# Patient Record
Sex: Male | Born: 1955 | Race: Black or African American | Hispanic: No | Smoking: Never smoker
Health system: Southern US, Community
[De-identification: ages and names within clinical notes are randomized; demographics above are authoritative.]

## PROBLEM LIST (undated history)

## (undated) DIAGNOSIS — F1010 Alcohol abuse, uncomplicated: Secondary | ICD-10-CM

## (undated) DIAGNOSIS — R569 Unspecified convulsions: Secondary | ICD-10-CM

## (undated) DIAGNOSIS — R011 Cardiac murmur, unspecified: Secondary | ICD-10-CM

## (undated) DIAGNOSIS — E669 Obesity, unspecified: Secondary | ICD-10-CM

## (undated) DIAGNOSIS — E039 Hypothyroidism, unspecified: Secondary | ICD-10-CM

## (undated) HISTORY — PX: GASTRIC BYPASS: SHX52

## (undated) HISTORY — PX: FRACTURE SURGERY: SHX138

---

## 2010-06-06 ENCOUNTER — Emergency Department (HOSPITAL_COMMUNITY): Admission: EM | Admit: 2010-06-06 | Discharge: 2010-06-06 | Payer: Self-pay | Admitting: Emergency Medicine

## 2011-02-23 LAB — RAPID URINE DRUG SCREEN, HOSP PERFORMED
Amphetamines: NOT DETECTED
Barbiturates: NOT DETECTED
Benzodiazepines: NOT DETECTED
Cocaine: NOT DETECTED

## 2011-02-23 LAB — COMPREHENSIVE METABOLIC PANEL
ALT: 21 U/L (ref 0–53)
BUN: 5 mg/dL — ABNORMAL LOW (ref 6–23)
CO2: 26 mEq/L (ref 19–32)
Calcium: 8.5 mg/dL (ref 8.4–10.5)
Creatinine, Ser: 1.02 mg/dL (ref 0.4–1.5)
GFR calc non Af Amer: >60 mL/min (ref >60)
Glucose, Bld: 132 mg/dL — ABNORMAL HIGH (ref 70–99)
Sodium: 143 mEq/L (ref 135–145)
Total Protein: 8.1 g/dL (ref 6.0–8.3)

## 2011-02-23 LAB — DIFFERENTIAL
Eosinophils Absolute: 0.1 10*3/uL (ref 0.0–0.7)
Lymphocytes Relative: 39 % (ref 12–46)
Lymphs Abs: 3.6 10*3/uL (ref 0.7–4.0)
Monocytes Relative: 11 % (ref 3–12)
Neutro Abs: 4.4 10*3/uL (ref 1.7–7.7)
Neutrophils Relative %: 49 % (ref 43–77)

## 2011-02-23 LAB — URINALYSIS, ROUTINE W REFLEX MICROSCOPIC
Hgb urine dipstick: NEGATIVE
Ketones, ur: NEGATIVE mg/dL
Protein, ur: NEGATIVE mg/dL
Urobilinogen, UA: 0.2 mg/dL (ref 0.0–1.0)

## 2011-02-23 LAB — CBC
HCT: 39.1 % (ref 39.0–52.0)
Hemoglobin: 13.3 g/dL (ref 13.0–17.0)
MCH: 29.1 pg (ref 26.0–34.0)
MCHC: 34 g/dL (ref 30.0–36.0)
RDW: 15.1 % (ref 11.5–15.5)

## 2011-02-23 LAB — ETHANOL: Alcohol, Ethyl (B): 331 mg/dL — ABNORMAL HIGH (ref 0–10)

## 2012-01-13 ENCOUNTER — Emergency Department (HOSPITAL_COMMUNITY)
Admission: EM | Admit: 2012-01-13 | Discharge: 2012-01-13 | Discharge status: Against Medical Advice | Payer: Self-pay | Attending: General Surgery | Admitting: General Surgery

## 2012-01-13 ENCOUNTER — Emergency Department (HOSPITAL_COMMUNITY)
Admission: EM | Admit: 2012-01-13 | Discharge: 2012-01-13 | Disposition: A | Discharge status: Home / Self Care | Payer: Self-pay | Attending: Emergency Medicine | Admitting: Emergency Medicine

## 2012-01-13 ENCOUNTER — Encounter (HOSPITAL_COMMUNITY): Payer: Self-pay | Admitting: Emergency Medicine

## 2012-01-13 DIAGNOSIS — F10929 Alcohol use, unspecified with intoxication, unspecified: Secondary | ICD-10-CM

## 2012-01-13 DIAGNOSIS — Z043 Encounter for examination and observation following other accident: Secondary | ICD-10-CM | POA: Insufficient documentation

## 2012-01-13 DIAGNOSIS — F1010 Alcohol abuse, uncomplicated: Secondary | ICD-10-CM | POA: Insufficient documentation

## 2012-01-13 DIAGNOSIS — R52 Pain, unspecified: Secondary | ICD-10-CM | POA: Insufficient documentation

## 2012-01-13 LAB — ETHANOL: Alcohol, Ethyl (B): 438 mg/dL (ref 0–11)

## 2012-01-13 NOTE — ED Notes (Signed)
Restraints removed at this time. Patient able to follow commands; alert to self, reoriented to location and time; responding appropriately to questions. Will continue to monitor.
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
## 2012-02-19 ENCOUNTER — Emergency Department (HOSPITAL_COMMUNITY): Payer: Self-pay

## 2012-02-19 ENCOUNTER — Emergency Department (HOSPITAL_COMMUNITY)
Admission: EM | Admit: 2012-02-19 | Discharge: 2012-02-19 | Disposition: A | Discharge status: Home / Self Care | Payer: Self-pay | Attending: Emergency Medicine | Admitting: Emergency Medicine

## 2012-02-19 ENCOUNTER — Encounter (HOSPITAL_COMMUNITY): Payer: Self-pay | Admitting: *Deleted

## 2012-02-19 DIAGNOSIS — F1010 Alcohol abuse, uncomplicated: Secondary | ICD-10-CM | POA: Insufficient documentation

## 2012-02-19 DIAGNOSIS — F10929 Alcohol use, unspecified with intoxication, unspecified: Secondary | ICD-10-CM

## 2012-02-19 HISTORY — DX: Alcohol abuse, uncomplicated: F10.10

## 2012-02-19 MED ORDER — SODIUM CHLORIDE 0.9 % IV BOLUS (SEPSIS): 1000.0000 mL | Freq: Once | INTRAVENOUS | Status: DC

## 2012-02-19 NOTE — ED Notes (Signed)
ZOX:[id number]<AV> Expected date:02/19/12<BR> Expected time:<BR> Means of arrival:<BR> Comments:<BR> EMS [facility] - etoh

## 2012-02-19 NOTE — ED Notes (Signed)
Pt left AMA. MD notified.  

## 2012-02-19 NOTE — ED Notes (Signed)
Pt in by [facility], picked up behind a gas station lying in the parking lot. ETOH on board. Pt admits to one bottle of wine today, but found with empty pack of beer. No c/o. PD requested transport to hospital because they felt he was AMS. A&Ox4.

## 2012-02-19 NOTE — Progress Notes (Signed)
Pt listed as self pay with no insurance coverage Pt confirms he is self pay [place] resident CM and [facility] coordinator unable to speak with him. Pt left AMA

## 2012-02-19 NOTE — ED Notes (Signed)
Did hourly rounding on patient and patient was not in room. Checked both bathrooms and the patient was not in either restroom. Another tech and I went and checked the waiting room and looked outside patient was not seen. We also asked a few staff members in triage if they seen him walk by and they stated no. Reported all this information to the RN and to the charge nurse on duty.
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
## 2012-02-19 NOTE — ED Notes (Signed)
Refusing iv placement at this time-will attempt later

## 2012-02-19 NOTE — ED Notes (Signed)
Pt refuses ct scan, labs, and iv

## 2012-02-19 NOTE — ED Notes (Signed)
REFUSING BASIC LABS. NURSE WAS AWARE OF PT REFUSING LABS

## 2012-02-19 NOTE — ED Notes (Signed)
Noncompliant with triage process-not willing to answer questions, put gown on

## 2012-02-19 NOTE — ED Notes (Signed)
pts family member came to the front window and said that she told the girl [place] at the front desk that she was here to see pt. Abbi told her that he could not have visitors at this time. When I talked to stacey his nurse she had returned from lunch and went into the room and pt was not there and all his belongings were gone. They looked around the dept he could not be found. Family member gave me his name and it did not pull up his information due to pt had not been registered due to he was not there when they went into the room. Registerstration gave me a number to look up and when i looked it up it has said he was not in the room. When I explained this to her she was upset that she was coming to get him and they were told that he was here. Elanine explained to her we were sorry and unless it was in the system we can not know when he left. They also wanted to know if anything was left in the room and elaine looked in the room and nothing was found left behind.

## 2016-01-25 ENCOUNTER — Inpatient Hospital Stay (HOSPITAL_COMMUNITY)
Admission: EM | Admit: 2016-01-25 | Discharge: 2016-01-30 | DRG: 897 | Disposition: A | Discharge status: Home / Self Care | Payer: Non-veteran care | Attending: Internal Medicine | Admitting: Internal Medicine

## 2016-01-25 ENCOUNTER — Observation Stay (HOSPITAL_COMMUNITY): Payer: Non-veteran care

## 2016-01-25 ENCOUNTER — Encounter (HOSPITAL_COMMUNITY): Payer: Self-pay | Admitting: Emergency Medicine

## 2016-01-25 ENCOUNTER — Emergency Department (HOSPITAL_COMMUNITY): Payer: Non-veteran care

## 2016-01-25 DIAGNOSIS — R569 Unspecified convulsions: Secondary | ICD-10-CM

## 2016-01-25 DIAGNOSIS — F10929 Alcohol use, unspecified with intoxication, unspecified: Secondary | ICD-10-CM | POA: Insufficient documentation

## 2016-01-25 DIAGNOSIS — E785 Hyperlipidemia, unspecified: Secondary | ICD-10-CM | POA: Diagnosis present

## 2016-01-25 DIAGNOSIS — F10931 Alcohol use, unspecified with withdrawal delirium: Secondary | ICD-10-CM | POA: Insufficient documentation

## 2016-01-25 DIAGNOSIS — N289 Disorder of kidney and ureter, unspecified: Secondary | ICD-10-CM

## 2016-01-25 DIAGNOSIS — Z6838 Body mass index (BMI) 38.0-38.9, adult: Secondary | ICD-10-CM

## 2016-01-25 DIAGNOSIS — R000 Tachycardia, unspecified: Secondary | ICD-10-CM | POA: Diagnosis present

## 2016-01-25 DIAGNOSIS — R32 Unspecified urinary incontinence: Secondary | ICD-10-CM | POA: Diagnosis present

## 2016-01-25 DIAGNOSIS — R011 Cardiac murmur, unspecified: Secondary | ICD-10-CM | POA: Diagnosis present

## 2016-01-25 DIAGNOSIS — F10129 Alcohol abuse with intoxication, unspecified: Secondary | ICD-10-CM

## 2016-01-25 DIAGNOSIS — E669 Obesity, unspecified: Secondary | ICD-10-CM | POA: Diagnosis present

## 2016-01-25 DIAGNOSIS — R739 Hyperglycemia, unspecified: Secondary | ICD-10-CM | POA: Diagnosis present

## 2016-01-25 DIAGNOSIS — R740 Nonspecific elevation of levels of transaminase and lactic acid dehydrogenase [LDH]: Secondary | ICD-10-CM | POA: Diagnosis present

## 2016-01-25 DIAGNOSIS — Y903 Blood alcohol level of 60-79 mg/100 ml: Secondary | ICD-10-CM | POA: Diagnosis present

## 2016-01-25 DIAGNOSIS — F1010 Alcohol abuse, uncomplicated: Secondary | ICD-10-CM | POA: Diagnosis present

## 2016-01-25 DIAGNOSIS — I10 Essential (primary) hypertension: Secondary | ICD-10-CM | POA: Diagnosis present

## 2016-01-25 DIAGNOSIS — F10231 Alcohol dependence with withdrawal delirium: Secondary | ICD-10-CM | POA: Diagnosis not present

## 2016-01-25 DIAGNOSIS — E872 Acidosis: Secondary | ICD-10-CM | POA: Diagnosis present

## 2016-01-25 DIAGNOSIS — E039 Hypothyroidism, unspecified: Secondary | ICD-10-CM | POA: Diagnosis present

## 2016-01-25 DIAGNOSIS — R4182 Altered mental status, unspecified: Secondary | ICD-10-CM

## 2016-01-25 DIAGNOSIS — N179 Acute kidney failure, unspecified: Secondary | ICD-10-CM | POA: Diagnosis present

## 2016-01-25 DIAGNOSIS — Z9884 Bariatric surgery status: Secondary | ICD-10-CM

## 2016-01-25 DIAGNOSIS — K7010 Alcoholic hepatitis without ascites: Secondary | ICD-10-CM | POA: Diagnosis present

## 2016-01-25 DIAGNOSIS — G9340 Encephalopathy, unspecified: Secondary | ICD-10-CM | POA: Diagnosis present

## 2016-01-25 DIAGNOSIS — R7989 Other specified abnormal findings of blood chemistry: Secondary | ICD-10-CM | POA: Diagnosis present

## 2016-01-25 DIAGNOSIS — R7401 Elevation of levels of liver transaminase levels: Secondary | ICD-10-CM | POA: Diagnosis present

## 2016-01-25 HISTORY — DX: Obesity, unspecified: E66.9

## 2016-01-25 HISTORY — DX: Cardiac murmur, unspecified: R01.1

## 2016-01-25 HISTORY — DX: Unspecified convulsions: R56.9

## 2016-01-25 HISTORY — DX: Hypothyroidism, unspecified: E03.9

## 2016-01-25 LAB — I-STAT ARTERIAL BLOOD GAS, ED
Acid-Base Excess: 1 mmol/L (ref 0.0–2.0)
BICARBONATE: 23.7 meq/L (ref 20.0–24.0)
O2 SAT: 94 %
PCO2 ART: 31.7 mmHg — AB (ref 35.0–45.0)
TCO2: 25 mmol/L (ref 0–100)
pH, Arterial: 7.481 — ABNORMAL HIGH (ref 7.350–7.450)
pO2, Arterial: 65 mmHg — ABNORMAL LOW (ref 80.0–100.0)

## 2016-01-25 LAB — COMPREHENSIVE METABOLIC PANEL
ALK PHOS: 85 U/L (ref 38–126)
ALT: 950 U/L — AB (ref 17–63)
AST: 1827 U/L — AB (ref 15–41)
Albumin: 4.3 g/dL (ref 3.5–5.0)
Anion gap: 19 — ABNORMAL HIGH (ref 5–15)
BILIRUBIN TOTAL: 0.7 mg/dL (ref 0.3–1.2)
BUN: 18 mg/dL (ref 6–20)
CALCIUM: 9.4 mg/dL (ref 8.9–10.3)
CHLORIDE: 101 mmol/L (ref 101–111)
CO2: 22 mmol/L (ref 22–32)
CREATININE: 1.32 mg/dL — AB (ref 0.61–1.24)
GFR, EST NON AFRICAN AMERICAN: 57 mL/min — AB (ref >60)
Glucose, Bld: 234 mg/dL — ABNORMAL HIGH (ref 65–99)
Potassium: 4.8 mmol/L (ref 3.5–5.1)
Sodium: 142 mmol/L (ref 135–145)
Total Protein: 8.2 g/dL — ABNORMAL HIGH (ref 6.5–8.1)

## 2016-01-25 LAB — CBG MONITORING, ED
Glucose-Capillary: 171 mg/dL — ABNORMAL HIGH (ref 65–99)
Glucose-Capillary: 163 mg/dL — ABNORMAL HIGH (ref 65–99)

## 2016-01-25 LAB — URINALYSIS, ROUTINE W REFLEX MICROSCOPIC
BILIRUBIN URINE: NEGATIVE
Glucose, UA: >1000 mg/dL — AB
HGB URINE DIPSTICK: NEGATIVE
Ketones, ur: NEGATIVE mg/dL
Leukocytes, UA: NEGATIVE
NITRITE: NEGATIVE
PROTEIN: NEGATIVE mg/dL
Specific Gravity, Urine: 1.028 (ref 1.005–1.030)
pH: 5.5 (ref 5.0–8.0)

## 2016-01-25 LAB — CBC WITH DIFFERENTIAL/PLATELET
BASOS PCT: 0 %
Basophils Absolute: 0 10*3/uL (ref 0.0–0.1)
EOS ABS: 0 10*3/uL (ref 0.0–0.7)
EOS PCT: 0 %
HCT: 47.7 % (ref 39.0–52.0)
Hemoglobin: 16.2 g/dL (ref 13.0–17.0)
LYMPHS ABS: 1.7 10*3/uL (ref 0.7–4.0)
Lymphocytes Relative: 18 %
MCH: 28.8 pg (ref 26.0–34.0)
MCHC: 34 g/dL (ref 30.0–36.0)
MCV: 84.7 fL (ref 78.0–100.0)
Monocytes Absolute: 1.4 10*3/uL — ABNORMAL HIGH (ref 0.1–1.0)
Monocytes Relative: 14 %
NEUTROS PCT: 68 %
Neutro Abs: 6.6 10*3/uL (ref 1.7–7.7)
PLATELETS: 231 10*3/uL (ref 150–400)
RBC: 5.63 MIL/uL (ref 4.22–5.81)
RDW: 13.3 % (ref 11.5–15.5)
WBC: 9.8 10*3/uL (ref 4.0–10.5)

## 2016-01-25 LAB — RAPID URINE DRUG SCREEN, HOSP PERFORMED
Amphetamines: NOT DETECTED
Barbiturates: NOT DETECTED
Benzodiazepines: NOT DETECTED
COCAINE: NOT DETECTED
OPIATES: NOT DETECTED
Tetrahydrocannabinol: NOT DETECTED

## 2016-01-25 LAB — VOLATILES,BLD-ACETONE,ETHANOL,ISOPROP,METHANOL
ACETONE, BLOOD: NEGATIVE % (ref 0.000–0.010)
ETHANOL, BLOOD: NEGATIVE % (ref 0.000–0.010)
Isopropanol, blood: NEGATIVE % (ref 0.000–0.010)
METHANOL, BLOOD: NEGATIVE % (ref 0.000–0.010)

## 2016-01-25 LAB — URINE MICROSCOPIC-ADD ON
BACTERIA UA: NONE SEEN
RBC / HPF: NONE SEEN RBC/hpf (ref 0–5)

## 2016-01-25 LAB — VITAMIN B12: VITAMIN B 12: 1002 pg/mL — AB (ref 180–914)

## 2016-01-25 LAB — I-STAT CG4 LACTIC ACID, ED: Lactic Acid, Venous: 7.37 mmol/L (ref 0.5–2.0)

## 2016-01-25 LAB — CK: Total CK: 120 U/L (ref 49–397)

## 2016-01-25 LAB — LACTIC ACID, PLASMA: Lactic Acid, Venous: 3.5 mmol/L (ref 0.5–2.0)

## 2016-01-25 LAB — AMMONIA: AMMONIA: 36 umol/L — AB (ref 9–35)

## 2016-01-25 LAB — ETHANOL: ALCOHOL ETHYL (B): 75 mg/dL — AB (ref <5)

## 2016-01-25 LAB — GLUCOSE, CAPILLARY: GLUCOSE-CAPILLARY: 135 mg/dL — AB (ref 65–99)

## 2016-01-25 MED ORDER — VITAMIN B-1 100 MG PO TABS: 100.0000 mg | ORAL_TABLET | Freq: Every day | ORAL | Status: DC

## 2016-01-25 MED ORDER — LORAZEPAM 2 MG/ML IJ SOLN: 1.0000 mg | Freq: Four times a day (QID) | INTRAMUSCULAR | Status: DC | PRN

## 2016-01-25 MED ORDER — ACETAMINOPHEN 325 MG PO TABS: 650.0000 mg | ORAL_TABLET | Freq: Four times a day (QID) | ORAL | Status: DC | PRN

## 2016-01-25 MED ORDER — LORAZEPAM 2 MG/ML IJ SOLN
1.0000 mg | Freq: Once | INTRAMUSCULAR | Status: AC
  Administered 2016-01-25: 1 mg via INTRAVENOUS
  Filled 2016-01-25: qty 1

## 2016-01-25 MED ORDER — THIAMINE HCL 100 MG/ML IJ SOLN: 100.0000 mg | Freq: Every day | INTRAMUSCULAR | Status: DC

## 2016-01-25 MED ORDER — SODIUM CHLORIDE 0.9 % IV SOLN
1000.0000 mL | INTRAVENOUS | Status: DC
  Administered 2016-01-25 – 2016-01-26 (×2): 1000 mL via INTRAVENOUS

## 2016-01-25 MED ORDER — ONDANSETRON HCL 4 MG/2ML IJ SOLN: 4.0000 mg | Freq: Four times a day (QID) | INTRAMUSCULAR | Status: DC | PRN

## 2016-01-25 MED ORDER — LORAZEPAM 2 MG/ML IJ SOLN
2.0000 mg | INTRAMUSCULAR | Status: DC | PRN
  Administered 2016-01-25 (×3): 2 mg via INTRAVENOUS
  Administered 2016-01-25: 3 mg via INTRAVENOUS
  Administered 2016-01-26 (×2): 2 mg via INTRAVENOUS
  Administered 2016-01-26 (×2): 3 mg via INTRAVENOUS
  Administered 2016-01-26 (×2): 2 mg via INTRAVENOUS
  Administered 2016-01-26: 3 mg via INTRAVENOUS
  Administered 2016-01-26 (×3): 2 mg via INTRAVENOUS
  Administered 2016-01-26 – 2016-01-27 (×5): 3 mg via INTRAVENOUS
  Filled 2016-01-25: qty 2
  Filled 2016-01-25: qty 1
  Filled 2016-01-25 (×2): qty 2
  Filled 2016-01-25 (×2): qty 1
  Filled 2016-01-25: qty 2
  Filled 2016-01-25: qty 1
  Filled 2016-01-25: qty 2
  Filled 2016-01-25 (×5): qty 1
  Filled 2016-01-25 (×2): qty 2
  Filled 2016-01-25 (×2): qty 1
  Filled 2016-01-25 (×2): qty 2

## 2016-01-25 MED ORDER — LORAZEPAM 2 MG/ML IJ SOLN: 1.0000 mg | INTRAMUSCULAR | Status: DC | PRN

## 2016-01-25 MED ORDER — LORAZEPAM 1 MG PO TABS: 1.0000 mg | ORAL_TABLET | Freq: Four times a day (QID) | ORAL | Status: DC | PRN

## 2016-01-25 MED ORDER — HEPARIN SODIUM (PORCINE) 5000 UNIT/ML IJ SOLN
5000.0000 [IU] | Freq: Three times a day (TID) | INTRAMUSCULAR | Status: DC
  Administered 2016-01-25 – 2016-01-26 (×5): 5000 [IU] via SUBCUTANEOUS
  Filled 2016-01-25 (×5): qty 1

## 2016-01-25 MED ORDER — FOLIC ACID 1 MG PO TABS: 1.0000 mg | ORAL_TABLET | Freq: Every day | ORAL | Status: DC

## 2016-01-25 MED ORDER — ONDANSETRON HCL 4 MG PO TABS: 4.0000 mg | ORAL_TABLET | Freq: Four times a day (QID) | ORAL | Status: DC | PRN

## 2016-01-25 MED ORDER — ACETAMINOPHEN 650 MG RE SUPP
650.0000 mg | Freq: Four times a day (QID) | RECTAL | Status: DC | PRN
Start: 2016-01-25 — End: 2016-01-26

## 2016-01-25 MED ORDER — SODIUM CHLORIDE 0.9 % IV SOLN
1000.0000 mg | Freq: Once | INTRAVENOUS | Status: AC
  Administered 2016-01-25: 1000 mg via INTRAVENOUS
  Filled 2016-01-25: qty 10

## 2016-01-25 MED ORDER — LORAZEPAM 2 MG/ML IJ SOLN
2.0000 mg | Freq: Once | INTRAMUSCULAR | Status: AC
  Administered 2016-01-25: 2 mg via INTRAVENOUS
  Filled 2016-01-25: qty 1

## 2016-01-25 MED ORDER — INSULIN ASPART 100 UNIT/ML ~~LOC~~ SOLN: 0.0000 [IU] | Freq: Every day | SUBCUTANEOUS | Status: DC

## 2016-01-25 MED ORDER — POLYETHYLENE GLYCOL 3350 17 G PO PACK: 17.0000 g | PACK | Freq: Every day | ORAL | Status: DC | PRN

## 2016-01-25 MED ORDER — SODIUM CHLORIDE 0.9 % IV SOLN
1000.0000 mL | Freq: Once | INTRAVENOUS | Status: AC
  Administered 2016-01-25: 1000 mL via INTRAVENOUS

## 2016-01-25 MED ORDER — ADULT MULTIVITAMIN W/MINERALS CH: 1.0000 | ORAL_TABLET | Freq: Every day | ORAL | Status: DC

## 2016-01-25 MED ORDER — SODIUM CHLORIDE 0.9 % IV SOLN: INTRAVENOUS | Status: DC

## 2016-01-25 MED ORDER — INSULIN ASPART 100 UNIT/ML ~~LOC~~ SOLN
0.0000 [IU] | Freq: Three times a day (TID) | SUBCUTANEOUS | Status: DC
Start: 2016-01-25 — End: 2016-01-26
  Administered 2016-01-25: 3 [IU] via SUBCUTANEOUS
  Filled 2016-01-25: qty 1

## 2016-01-25 NOTE — Progress Notes (Signed)
Called Dr. Lendell Caprice to get order for sitter.

## 2016-01-25 NOTE — Progress Notes (Signed)
EEG completed; results pending.    
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
## 2016-01-25 NOTE — ED Notes (Signed)
Spoke with Dr. Benjamine Mola regarding pt. MRI. Will hold on MRI at this time. Will hold additional doses of ativan at this time.

## 2016-01-25 NOTE — ED Notes (Signed)
Pt. Will not keep gown or monitors on at this time.

## 2016-01-25 NOTE — ED Notes (Signed)
PATIENT refused to keep IV fluids connected RN disconnected RN removed.

## 2016-01-25 NOTE — Procedures (Signed)
ELECTROENCEPHALOGRAM REPORT  Date of Study: 01/25/2016  Patient's Name: Aaron Norman MRN: [id number] Date of Birth: 1955-12-29  Referring Provider: Dr. Ritta Slot  Clinical History: This is a 37 yaer old man with new onset seizures.   Medications: acetaminophen (TYLENOL) tablet 650 mg  Technical Summary: A multichannel digital EEG recording measured by the international 10-20 system with electrodes applied with paste and impedances below 5000 ohms performed as portable with EKG monitoring in an awake and asleep patient.  Hyperventilation and photic stimulation were not performed.  The digital EEG was referentially recorded, reformatted, and digitally filtered in a variety of bipolar and referential montages for optimal display.   Description: The patient is awake and asleep during the recording. He is noted to be restless during the study. There is no clear posterior dominant rhythm. The background consists of a large amount of diffuse medium voltage 2-3 Hz delta slowing, with occasional 4-5 Hz theta slowing seen, at times sharply contoured with triphasic-like appearance noted. During drowsiness and sleep, there is an increase in theta and delta slowing of the background with poorly formed vertex waves seen.  Hyperventilation and photic stimulation were not performed.  There were no epileptiform discharges or electrographic seizures seen.    EKG lead showed sinus tachycardia.  Impression: This awake and asleep EEG is abnormal due to moderate diffuse slowing of the waking background, at times with triphasic-like appearance.  Clinical Correlation of the above findings indicates diffuse cerebral dysfunction that is non-specific in etiology and can be seen with hypoxic/ischemic injury, toxic/metabolic encephalopathies, or medication effect.  The absence of epileptiform discharges does not rule out a clinical diagnosis of epilepsy.  Clinical correlation is advised.   Patrcia Dolly,  M.D.

## 2016-01-25 NOTE — ED Provider Notes (Addendum)
CSN: [id number]     Arrival date & time 01/25/16  0346 History   First MD Initiated Contact with Patient 01/25/16 0358     Chief Complaint  Patient presents with  . Seizures     (Consider location/radiation/quality/duration/timing/severity/associated sxs/prior Treatment) Patient is a 60 y.o. male presenting with seizures. The history is provided by the EMS personnel and a relative. The history is limited by the condition of the patient (Unresponsive).  Seizures He was brought in by EMS after having a seizure at home. Family member states that he has a history of alcohol abuse and had resumed drinking about 3 days ago and has been drinking heavily during that time. Palate member speculates that he fell out of bed and hit his head prior to seizure but this was not witnessed. She describes a generalized seizure. EMS reports additional seizure in the ambulance and was treated with midazolam. He has no prior history of seizures.  Past Medical History  Diagnosis Date  . Alcohol abuse    History reviewed. No pertinent past surgical history. No family history on file. Social History  Substance Use Topics  . Smoking status: Unknown If Ever Smoked  . Smokeless tobacco: None  . Alcohol Use: Yes     Comment: heavily    Review of Systems  Unable to perform ROS: Patient unresponsive  Neurological: Positive for seizures.      Allergies  Other  Home Medications   Prior to Admission medications   Not on File   BP 149/99 mmHg  Pulse 112  Temp(Src) 99.3 F (37.4 C) (Axillary)  Resp 18  SpO2 97% Physical Exam  Nursing note and vitals reviewed.  60 year old male, resting comfortably and in no acute distress. Vital signs are significant for hypertension. Oxygen saturation is 97%, which is normal. Head is normocephalic and atraumatic. PERRLA. Oropharynx is clear. Neck is nontender without adenopathy or JVD. Back is nontender and there is no CVA tenderness. Lungs are clear without  rales, wheezes, or rhonchi. Chest is nontender. Heart has regular rate and rhythm without murmur. Abdomen is soft, flat, nontender without masses or hepatosplenomegaly and peristalsis is normoactive. Extremities have no cyanosis or edema, full range of motion is present. Skin is warm and dry without rash. Neurologic: he responds to deep pain but is not observed to have any purposeful movement, cranial nerves are grossly intact, there are nogross motor deficits.  ED Course  Procedures (including critical care time) Labs Review Results for orders placed or performed during the hospital encounter of 01/25/16  Ethanol  Result Value Ref Range   Alcohol, Ethyl (B) 75 (H) <5 mg/dL  Comprehensive metabolic panel  Result Value Ref Range   Sodium 142 135 - 145 mmol/L   Potassium 4.8 3.5 - 5.1 mmol/L   Chloride 101 101 - 111 mmol/L   CO2 22 22 - 32 mmol/L   Glucose, Bld 234 (H) 65 - 99 mg/dL   BUN 18 6 - 20 mg/dL   Creatinine, Ser 1.47 (H) 0.61 - 1.24 mg/dL   Calcium 9.4 8.9 - 82.9 mg/dL   Total Protein 8.2 (H) 6.5 - 8.1 g/dL   Albumin 4.3 3.5 - 5.0 g/dL   AST 5621 (H) 15 - 41 U/L   ALT 950 (H) 17 - 63 U/L   Alkaline Phosphatase 85 38 - 126 U/L   Total Bilirubin 0.7 0.3 - 1.2 mg/dL   GFR calc non Af Amer 57 (L) >60 mL/min   GFR calc Af  Amer >60 >60 mL/min   Anion gap 19 (H) 5 - 15  CBC with Differential  Result Value Ref Range   WBC 9.8 4.0 - 10.5 K/uL   RBC 5.63 4.22 - 5.81 MIL/uL   Hemoglobin 16.2 13.0 - 17.0 g/dL   HCT 16.1 09.6 - 04.5 %   MCV 84.7 78.0 - 100.0 fL   MCH 28.8 26.0 - 34.0 pg   MCHC 34.0 30.0 - 36.0 g/dL   RDW 40.9 81.1 - 91.4 %   Platelets 231 150 - 400 K/uL   Neutrophils Relative % 68 %   Neutro Abs 6.6 1.7 - 7.7 K/uL   Lymphocytes Relative 18 %   Lymphs Abs 1.7 0.7 - 4.0 K/uL   Monocytes Relative 14 %   Monocytes Absolute 1.4 (H) 0.1 - 1.0 K/uL   Eosinophils Relative 0 %   Eosinophils Absolute 0.0 0.0 - 0.7 K/uL   Basophils Relative 0 %   Basophils  Absolute 0.0 0.0 - 0.1 K/uL  Urinalysis, Routine w reflex microscopic  Result Value Ref Range   Color, Urine YELLOW YELLOW   APPearance CLEAR CLEAR   Specific Gravity, Urine 1.028 1.005 - 1.030   pH 5.5 5.0 - 8.0   Glucose, UA >1000 (A) NEGATIVE mg/dL   Hgb urine dipstick NEGATIVE NEGATIVE   Bilirubin Urine NEGATIVE NEGATIVE   Ketones, ur NEGATIVE NEGATIVE mg/dL   Protein, ur NEGATIVE NEGATIVE mg/dL   Nitrite NEGATIVE NEGATIVE   Leukocytes, UA NEGATIVE NEGATIVE  Urine rapid drug screen (hosp performed)  Result Value Ref Range   Opiates NONE DETECTED NONE DETECTED   Cocaine NONE DETECTED NONE DETECTED   Benzodiazepines NONE DETECTED NONE DETECTED   Amphetamines NONE DETECTED NONE DETECTED   Tetrahydrocannabinol NONE DETECTED NONE DETECTED   Barbiturates NONE DETECTED NONE DETECTED  Urine microscopic-add on  Result Value Ref Range   Squamous Epithelial / LPF 0-5 (A) NONE SEEN   WBC, UA 0-5 0 - 5 WBC/hpf   RBC / HPF NONE SEEN 0 - 5 RBC/hpf   Bacteria, UA NONE SEEN NONE SEEN  I-Stat CG4 Lactic Acid, ED  Result Value Ref Range   Lactic Acid, Venous 7.37 (HH) 0.5 - 2.0 mmol/L   Comment NOTIFIED PHYSICIAN    Imaging Review Ct Head Wo Contrast  01/25/2016  CLINICAL DATA:  Seizure today. No history of seizures. Nonresponsive. History of alcohol abuse. EXAM: CT HEAD WITHOUT CONTRAST CT CERVICAL SPINE WITHOUT CONTRAST TECHNIQUE: Multidetector CT imaging of the head and cervical spine was performed following the standard protocol without intravenous contrast. Multiplanar CT image reconstructions of the cervical spine were also generated. COMPARISON:  None. FINDINGS: CT HEAD FINDINGS The ventricles and sulci are normal. No intraparenchymal hemorrhage, mass effect nor midline shift. No acute large vascular territory infarcts. No abnormal extra-axial fluid collections. Basal cisterns are patent. Mild calcific atherosclerosis of the carotid siphons. No skull fracture. The included ocular globes  and orbital contents are non-suspicious. Old RIGHT medial orbital blowout fracture. Mild paranasal sinus mucosal thickening. CT CERVICAL SPINE FINDINGS Large body habitus results in overall noisy image quality. Cervical vertebral bodies intact and aligned with maintenance of cervical lordosis. Moderate disc C6-7 disc height loss, with irregular endplates most compatible with Schmorl's nodes, sclerosis and spurring. Multilevel mild facet arthropathy. C1-2 articulation maintained. Medial course of LEFT Common carotid artery, normal variant. IMPRESSION: CT HEAD: Negative noncontrast CT head for age. CT CERVICAL SPINE: Straightened cervical lordosis without acute fracture or malalignment. Electronically Signed   By: Pernell Dupre  Bloomer M.D.   On: 01/25/2016 04:50   Ct Cervical Spine Wo Contrast  01/25/2016  CLINICAL DATA:  Seizure today. No history of seizures. Nonresponsive. History of alcohol abuse. EXAM: CT HEAD WITHOUT CONTRAST CT CERVICAL SPINE WITHOUT CONTRAST TECHNIQUE: Multidetector CT imaging of the head and cervical spine was performed following the standard protocol without intravenous contrast. Multiplanar CT image reconstructions of the cervical spine were also generated. COMPARISON:  None. FINDINGS: CT HEAD FINDINGS The ventricles and sulci are normal. No intraparenchymal hemorrhage, mass effect nor midline shift. No acute large vascular territory infarcts. No abnormal extra-axial fluid collections. Basal cisterns are patent. Mild calcific atherosclerosis of the carotid siphons. No skull fracture. The included ocular globes and orbital contents are non-suspicious. Old RIGHT medial orbital blowout fracture. Mild paranasal sinus mucosal thickening. CT CERVICAL SPINE FINDINGS Large body habitus results in overall noisy image quality. Cervical vertebral bodies intact and aligned with maintenance of cervical lordosis. Moderate disc C6-7 disc height loss, with irregular endplates most compatible with Schmorl's  nodes, sclerosis and spurring. Multilevel mild facet arthropathy. C1-2 articulation maintained. Medial course of LEFT Common carotid artery, normal variant. IMPRESSION: CT HEAD: Negative noncontrast CT head for age. CT CERVICAL SPINE: Straightened cervical lordosis without acute fracture or malalignment. Electronically Signed   By: Awilda Metro M.D.   On: 01/25/2016 04:50   I have personally reviewed and evaluated these images and lab results as part of my medical decision-making.  CRITICAL CARE Performed by: Dione Booze Total critical care time: 90 minutes Critical care time was exclusive of separately billable procedures and treating other patients. Critical care was necessary to treat or prevent imminent or life-threatening deterioration. Critical care was time spent personally by me on the following activities: development of treatment plan with patient and/or surrogate as well as nursing, discussions with consultants, evaluation of patient's response to treatment, examination of patient, obtaining history from patient or surrogate, ordering and performing treatments and interventions, ordering and review of laboratory studies, ordering and review of radiographic studies, pulse oximetry and re-evaluation of patient's condition.  MDM   Final diagnoses:  Seizure (HCC)  Alcohol intoxication, with unspecified complication (HCC)  Altered mental status, unspecified altered mental status type  Renal insufficiency  Elevated lactic acid level    Seizure,, possible head injury. Screening labs are obtained including alcohol level and he will be sent for CT of head. Because of repeated seizures, he is given a dose of lorazepam. RN does state he had an additional seizure in the ED prior to my going in to see him. Old records are reviewed showing ED visits for alcohol abuse 4 years ago but no visits since then and no record of seizures.  Alcohol level is come back at 75. If he has been drinking  heavily for an extended period of time, level of 75 could be well enough to in precipitate alcohol withdrawal seizures. Patient was observed and he became slightly more awake but does not never to the point where he would follow commands or speak. He was noted to get slightly tremulous so he was given additional lorazepam and was given loading dose of levetiracetam. CT showed no evidence of intracranial injury. Case has been discussed with Toya Smothers, nurse practitioner for [facility], who agrees to admit the patient to stepdown unit. Neurology has been consulted and feel he most likely has alcohol withdrawal seizure.  Dione Booze, MD 01/25/16 4782  Dione Booze, MD 01/25/16 [phone]

## 2016-01-25 NOTE — ED Notes (Signed)
Per EMS, pt from home with c/o seizure. Pt has no seizure history. Responsive to painful stimuli. CBG-220 BP-150/90, HR-120. Upon arrival, pt experiencing jerking movements to the arms and legs. EDP notified

## 2016-01-25 NOTE — ED Notes (Signed)
This RN called to room by patient sister, pt. Attempting to get out of bed. Pt. Remains nonverbal, alert with eyes open. Pt. Redirected back to bed, security at bedside for assistance. Admitting NP at bedside.

## 2016-01-25 NOTE — ED Notes (Signed)
Per MD order do not given Sliding Scale Insulin for  CBG less than 200

## 2016-01-25 NOTE — ED Notes (Signed)
Pt transported to EEG 

## 2016-01-25 NOTE — Progress Notes (Signed)
Received patient from ED accompanied by nurse and 3 Security officers, family also present. Pt noted to be confused and squirming all over the bed. CHG bath given. Placed on CCM and patient pulling at leads. Pt adm called to CCMD.

## 2016-01-25 NOTE — H&P (Signed)
[facility] History and Physical  Boubacar Lerette ZOX:[id number] DOB: 04-May-1956 DOA: 01/25/2016  Referring physician: Preston Fleeting PCP: No primary care provider on file. [facility] [place]  Chief Complaint: acute encephalopathy/tremors  HPI: Aaron Norman is a 60 y.o. male past medical history that includes alcohol abuse, obesity as as to the emergency department via EMS from home with the chief complaint of seizure. He had additional seizure activity in the ambulance as well as in the emergency department.  Formation is provided by the sister who is at the bedside. Reports he has a history of alcohol abuse was sober for many many years while in prison was released 3 months ago. She states last week she was aware that he drank a bottle of wine and he "seemed okay". She reports coming home and find that him on the floor and "his whole body was jerking". She also reports that over the last 3 days he's had 3 bottles of wine. She denies any recent illness travel or sick contacts. She does report loss of bladder control. He called EMS who transported him to emergency department and reported an additional seizure. hHe was provided with midazolam. History denies patient having any history of seizures.  In the emergency department he is afebrile hemodynamically stable with a tachycardia of 118. He is provided with IV fluids, 2 mg of Ativan and 1000 mg of Keppra.  Review of Systems:  10 point review of systems complete and all systems are negative except as indicated in the history of present illness  Past Medical History  Diagnosis Date  . Alcohol abuse   . Obesity   . Seizure [facility])    Past Surgical History  Procedure Laterality Date  . Gastric bypass     Social History:  reports that he drinks alcohol. His tobacco and drug histories are not on file. He currently lives with his sister was incarcerated until 3 months ago for an extended period of time showed drinking last week is independent with  ADLs Allergies  Allergen Reactions  . Other     Narcotics "makes me crazy."    No family history on file. family medical history of uterus Sr. positive for CAD, prostate cancer hypertension and obesity, ABDs  Prior to Admission medications   Not on File   Physical Exam: Filed Vitals:   01/25/16 0630 01/25/16 0645 01/25/16 0730 01/25/16 0745  BP: 165/69 146/97 149/99 140/96  Pulse: 112 112 112 123  Temp:      TempSrc:      Resp: SpO2: 96% 97% 97% 95%    Wt Readings from Last 3 Encounters:  No data found for Wt    General:  Appears quite lethargic and postictal, obese  Eyes: PERRL, normal lids, irises & conjunctiva ENT: grossly normal hearing, lips & tongue Neck: no LAD, masses or thyromegaly Cardiovascular: RRR, no m/r/g. No LE edema. Telemetry: SR, no arrhythmias  Respiratory: CTA bilaterally, no w/r/r. Normal respiratory effort. Somewhat shallow Abdomen: soft, ntnd obese +BS Skin: no rash or induration seen on limited exam Musculoskeletal: grossly normal tone BUE/BLE Psychiatric: grossly normal mood and affect, speech fluent and appropriate Neurologic: Opens eyes slightly and briefly to verbal stimuli. Unable to respond verbally or follow commands. Does attempt to reposition himself in the bed.           Labs on Admission:  Basic Metabolic Panel:  Recent Labs Lab 01/25/16 0456  NA 142  K 4.8  CL 101  CO2  22  GLUCOSE 234*  BUN 18  CREATININE 1.32*  CALCIUM 9.4   Liver Function Tests:  Recent Labs Lab 01/25/16 0456  AST 1827*  ALT 950*  ALKPHOS 85  BILITOT 0.7  PROT 8.2*  ALBUMIN 4.3   No results for input(s): LIPASE, AMYLASE in the last 168 hours.  Recent Labs Lab 01/25/16 0816  AMMONIA 36*   CBC:  Recent Labs Lab 01/25/16 0456  WBC 9.8  NEUTROABS 6.6  HGB 16.2  HCT 47.7  MCV 84.7  PLT 231   Cardiac Enzymes:  Recent Labs Lab 01/25/16 0816  CKTOTAL 120    BNP (last 3 results) No results for input(s): BNP in  the last 8760 hours.  ProBNP (last 3 results) No results for input(s): PROBNP in the last 8760 hours.  CBG: No results for input(s): GLUCAP in the last 168 hours.  Radiological Exams on Admission: Ct Head Wo Contrast  01/25/2016  CLINICAL DATA:  Seizure today. No history of seizures. Nonresponsive. History of alcohol abuse. EXAM: CT HEAD WITHOUT CONTRAST CT CERVICAL SPINE WITHOUT CONTRAST TECHNIQUE: Multidetector CT imaging of the head and cervical spine was performed following the standard protocol without intravenous contrast. Multiplanar CT image reconstructions of the cervical spine were also generated. COMPARISON:  None. FINDINGS: CT HEAD FINDINGS The ventricles and sulci are normal. No intraparenchymal hemorrhage, mass effect nor midline shift. No acute large vascular territory infarcts. No abnormal extra-axial fluid collections. Basal cisterns are patent. Mild calcific atherosclerosis of the carotid siphons. No skull fracture. The included ocular globes and orbital contents are non-suspicious. Old RIGHT medial orbital blowout fracture. Mild paranasal sinus mucosal thickening. CT CERVICAL SPINE FINDINGS Large body habitus results in overall noisy image quality. Cervical vertebral bodies intact and aligned with maintenance of cervical lordosis. Moderate disc C6-7 disc height loss, with irregular endplates most compatible with Schmorl's nodes, sclerosis and spurring. Multilevel mild facet arthropathy. C1-2 articulation maintained. Medial course of LEFT Common carotid artery, normal variant. IMPRESSION: CT HEAD: Negative noncontrast CT head for age. CT CERVICAL SPINE: Straightened cervical lordosis without acute fracture or malalignment. Electronically Signed   By: Awilda Metro M.D.   On: 01/25/2016 04:50   Ct Cervical Spine Wo Contrast  01/25/2016  CLINICAL DATA:  Seizure today. No history of seizures. Nonresponsive. History of alcohol abuse. EXAM: CT HEAD WITHOUT CONTRAST CT CERVICAL SPINE  WITHOUT CONTRAST TECHNIQUE: Multidetector CT imaging of the head and cervical spine was performed following the standard protocol without intravenous contrast. Multiplanar CT image reconstructions of the cervical spine were also generated. COMPARISON:  None. FINDINGS: CT HEAD FINDINGS The ventricles and sulci are normal. No intraparenchymal hemorrhage, mass effect nor midline shift. No acute large vascular territory infarcts. No abnormal extra-axial fluid collections. Basal cisterns are patent. Mild calcific atherosclerosis of the carotid siphons. No skull fracture. The included ocular globes and orbital contents are non-suspicious. Old RIGHT medial orbital blowout fracture. Mild paranasal sinus mucosal thickening. CT CERVICAL SPINE FINDINGS Large body habitus results in overall noisy image quality. Cervical vertebral bodies intact and aligned with maintenance of cervical lordosis. Moderate disc C6-7 disc height loss, with irregular endplates most compatible with Schmorl's nodes, sclerosis and spurring. Multilevel mild facet arthropathy. C1-2 articulation maintained. Medial course of LEFT Common carotid artery, normal variant. IMPRESSION: CT HEAD: Negative noncontrast CT head for age. CT CERVICAL SPINE: Straightened cervical lordosis without acute fracture or malalignment. Electronically Signed   By: Awilda Metro M.D.   On: 01/25/2016 04:50    EKG: Independently reviewed  Assessment/Plan Principal Problem:   Acute encephalopathy Active Problems:   Seizure (HCC)   Tachycardia   Obesity   ETOH abuse   Acute kidney injury (HCC)   Elevated lactic acid level   Elevated transaminase level   Hyperglycemia  #1. Acute encephalopathy. Most likely extended post ictal phase related to acute seizure. CT of the head without acute abnormalities. He is gradually becoming less lethargic. Ammonia level 36, CK within the limits of normal, Actiq acid 7.37. He is afebrile no leukocytosis and was greater than 1000  glucose otherwise unremarkable urine drug screen negative -Admit to step down -Continue Keppra per neurology -Obtain B-12 folate -Monitor closely  #2. Seizure. No history of same. History EtOH and Likely related to EtOH. Alcohol level 75 on admission. Evaluated by neurology who opined concern for EtOH withdrawal -MRI -EEG -Defer Keppra dose to neurology  #3. Acute kidney injury. Creatinine 1.36 on admission. Likely related to above -IV fluids -Urine output -Recheck in the morning  #4. Elevated lactic acid level. Lactic acid 7.37 on admission. Likely related to #2. His afebrile no leukocytosis hemodynamically stable. CK within the limits of normal -Vigorous IV fluids -Recheck early afternoon  #5. Elevated transaminase level. -hepatitis panel -trend levels -HIV antibody -if no improvement consider [place]  #6. EtOH abuse. Patient recently resumed drinking after extended incarceration. -Social work -Consider CIWA  protocol depending on #2  7. Tachycardia. Likely related to #2. Be related to withdrawal. -Continue IV fluids -Monitor on telemetry -If no improvement consider beta blocker  8. Hyperglycemia. Serum glucose 237 on admission. No documented history of diabetes. Sister reports positive family history -Obtain a hemoglobin A1c - sliding Scale insulin -May need close outpatient follow-up  #9. Obesity. BMI greater than 35. History of gastric bypass surgery -Nutritional consult -Car modified diet   Code Status: full DVT Prophylaxis: Family Communication: sister at bedside Disposition Plan: home when ready  Time spent: 60 minutes  [facility] M [facility]

## 2016-01-25 NOTE — ED Notes (Signed)
Spoke with MD advised hold Insulin until she speak with RN. Wants to review chart.

## 2016-01-25 NOTE — ED Notes (Signed)
Patient reposition

## 2016-01-26 ENCOUNTER — Inpatient Hospital Stay (HOSPITAL_COMMUNITY): Payer: Non-veteran care

## 2016-01-26 ENCOUNTER — Observation Stay (HOSPITAL_COMMUNITY): Payer: Non-veteran care

## 2016-01-26 DIAGNOSIS — N179 Acute kidney failure, unspecified: Secondary | ICD-10-CM | POA: Diagnosis present

## 2016-01-26 DIAGNOSIS — E872 Acidosis: Secondary | ICD-10-CM | POA: Diagnosis present

## 2016-01-26 DIAGNOSIS — E039 Hypothyroidism, unspecified: Secondary | ICD-10-CM | POA: Diagnosis present

## 2016-01-26 DIAGNOSIS — Y903 Blood alcohol level of 60-79 mg/100 ml: Secondary | ICD-10-CM | POA: Diagnosis present

## 2016-01-26 DIAGNOSIS — Z6838 Body mass index (BMI) 38.0-38.9, adult: Secondary | ICD-10-CM | POA: Diagnosis not present

## 2016-01-26 DIAGNOSIS — R32 Unspecified urinary incontinence: Secondary | ICD-10-CM | POA: Diagnosis present

## 2016-01-26 DIAGNOSIS — Z9884 Bariatric surgery status: Secondary | ICD-10-CM | POA: Diagnosis not present

## 2016-01-26 DIAGNOSIS — I10 Essential (primary) hypertension: Secondary | ICD-10-CM | POA: Diagnosis present

## 2016-01-26 DIAGNOSIS — E669 Obesity, unspecified: Secondary | ICD-10-CM | POA: Diagnosis present

## 2016-01-26 DIAGNOSIS — R740 Nonspecific elevation of levels of transaminase and lactic acid dehydrogenase [LDH]: Secondary | ICD-10-CM | POA: Diagnosis present

## 2016-01-26 DIAGNOSIS — K7010 Alcoholic hepatitis without ascites: Secondary | ICD-10-CM | POA: Diagnosis present

## 2016-01-26 DIAGNOSIS — R011 Cardiac murmur, unspecified: Secondary | ICD-10-CM | POA: Diagnosis present

## 2016-01-26 DIAGNOSIS — R569 Unspecified convulsions: Secondary | ICD-10-CM | POA: Diagnosis present

## 2016-01-26 DIAGNOSIS — R000 Tachycardia, unspecified: Secondary | ICD-10-CM | POA: Diagnosis present

## 2016-01-26 DIAGNOSIS — R739 Hyperglycemia, unspecified: Secondary | ICD-10-CM | POA: Diagnosis present

## 2016-01-26 DIAGNOSIS — F10231 Alcohol dependence with withdrawal delirium: Secondary | ICD-10-CM | POA: Diagnosis present

## 2016-01-26 DIAGNOSIS — E785 Hyperlipidemia, unspecified: Secondary | ICD-10-CM | POA: Diagnosis present

## 2016-01-26 LAB — COMPREHENSIVE METABOLIC PANEL
ALT: 591 U/L — AB (ref 17–63)
AST: 579 U/L — AB (ref 15–41)
Albumin: 3.9 g/dL (ref 3.5–5.0)
Alkaline Phosphatase: 83 U/L (ref 38–126)
Anion gap: 14 (ref 5–15)
BUN: 27 mg/dL — AB (ref 6–20)
CHLORIDE: 109 mmol/L (ref 101–111)
CO2: 23 mmol/L (ref 22–32)
CREATININE: 1.65 mg/dL — AB (ref 0.61–1.24)
Calcium: 9.1 mg/dL (ref 8.9–10.3)
GFR calc Af Amer: 51 mL/min — ABNORMAL LOW (ref >60)
GFR calc non Af Amer: 44 mL/min — ABNORMAL LOW (ref >60)
Glucose, Bld: 159 mg/dL — ABNORMAL HIGH (ref 65–99)
Potassium: 5.5 mmol/L — ABNORMAL HIGH (ref 3.5–5.1)
SODIUM: 146 mmol/L — AB (ref 135–145)
Total Bilirubin: 2 mg/dL — ABNORMAL HIGH (ref 0.3–1.2)
Total Protein: 6.9 g/dL (ref 6.5–8.1)

## 2016-01-26 LAB — GLUCOSE, CAPILLARY
GLUCOSE-CAPILLARY: 135 mg/dL — AB (ref 65–99)
GLUCOSE-CAPILLARY: 140 mg/dL — AB (ref 65–99)
Glucose-Capillary: 159 mg/dL — ABNORMAL HIGH (ref 65–99)
Glucose-Capillary: 152 mg/dL — ABNORMAL HIGH (ref 65–99)
Glucose-Capillary: 147 mg/dL — ABNORMAL HIGH (ref 65–99)

## 2016-01-26 LAB — CBC
HCT: 43.1 % (ref 39.0–52.0)
Hemoglobin: 14.3 g/dL (ref 13.0–17.0)
MCH: 28.6 pg (ref 26.0–34.0)
MCHC: 33.2 g/dL (ref 30.0–36.0)
MCV: 86.2 fL (ref 78.0–100.0)
PLATELETS: 244 10*3/uL (ref 150–400)
RBC: 5 MIL/uL (ref 4.22–5.81)
RDW: 13.7 % (ref 11.5–15.5)
WBC: 15.5 10*3/uL — ABNORMAL HIGH (ref 4.0–10.5)

## 2016-01-26 LAB — HEPATITIS PANEL, ACUTE
HCV Ab: <0.1 s/co ratio (ref 0.0–0.9)
HEP B C IGM: NEGATIVE
HEP B S AG: NEGATIVE
Hep A IgM: NEGATIVE

## 2016-01-26 LAB — HEMOGLOBIN A1C
Hgb A1c MFr Bld: 6.3 % — ABNORMAL HIGH (ref 4.8–5.6)
Mean Plasma Glucose: 134 mg/dL

## 2016-01-26 LAB — HIV ANTIBODY (ROUTINE TESTING W REFLEX): HIV Screen 4th Generation wRfx: NONREACTIVE

## 2016-01-26 MED ORDER — ACETAMINOPHEN 325 MG PO TABS: 325.0000 mg | ORAL_TABLET | Freq: Four times a day (QID) | ORAL | Status: DC | PRN

## 2016-01-26 MED ORDER — HALOPERIDOL LACTATE 5 MG/ML IJ SOLN
2.0000 mg | Freq: Four times a day (QID) | INTRAMUSCULAR | Status: DC | PRN
  Administered 2016-01-26: 2 mg via INTRAVENOUS
  Filled 2016-01-26: qty 1

## 2016-01-26 MED ORDER — ACETAMINOPHEN 650 MG RE SUPP
325.0000 mg | Freq: Four times a day (QID) | RECTAL | Status: DC | PRN
Start: 2016-01-26 — End: 2016-01-30

## 2016-01-26 MED ORDER — FOLIC ACID 5 MG/ML IJ SOLN
1.0000 mg | Freq: Every day | INTRAMUSCULAR | Status: DC
  Administered 2016-01-26 – 2016-01-28 (×3): 1 mg via INTRAVENOUS
  Filled 2016-01-26 (×4): qty 0.2

## 2016-01-26 MED ORDER — HALOPERIDOL LACTATE 5 MG/ML IJ SOLN
5.0000 mg | Freq: Four times a day (QID) | INTRAMUSCULAR | Status: DC | PRN
  Administered 2016-01-26 – 2016-01-27 (×2): 5 mg via INTRAVENOUS
  Filled 2016-01-26 (×3): qty 1

## 2016-01-26 MED ORDER — SODIUM CHLORIDE 0.45 % IV SOLN
INTRAVENOUS | Status: DC
  Administered 2016-01-26 – 2016-01-27 (×2): via INTRAVENOUS
  Administered 2016-01-27 – 2016-01-28 (×2): 75 mL/h via INTRAVENOUS
  Administered 2016-01-28 – 2016-01-30 (×3): via INTRAVENOUS

## 2016-01-26 NOTE — Progress Notes (Signed)
[name] TEAM 1 - Stepdown/ICU TEAM PROGRESS NOTE  Aaron Norman NWG:[id number] DOB: 1956-04-20 DOA: 01/25/2016 PCP: No primary care provider on file.  Admit HPI / Brief Narrative: 60 y.o. male w/ a history of alcohol abuse and obesity who presented to the ED via EMS from home with the chief complaint of seizure. He had additional seizure activity in the ambulance as well as in the emergency department.  His sister reported he was released from prison 3 months prior. She came home and found him on the floor w/ "his whole body was jerking". She also reported that he had been drinking heavily in the days prior.   In the emergency department he was afebrile and hemodynamically stable with a tachycardia of 118.   HPI/Subjective: The patient is severely confused.  He is not able to provide any history.  He does not appear to be in acute respiratory distress or significant pain.  He has made multiple attempts to get up and get out of bed.  At times he has been agitated per staff.  Assessment/Plan:  Acute encephalopathy B12 normal - ammonia essentially normal - CT head unremarkable - suspect this is a combination of EtOH withdrawal and possible post-ictal confusion - cont CIWA   Seizure Likely due to EtOH withdrawal - Neurology following   Lactic acidosis Due to above - improving w/ volume expansion   Transaminitis Acute hepatitis panel unremarkable - likely due to EtOH abuse w/ low grade alcoholic hepatitis - improving rapidly w/o steroid tx   Acute kidney injury Creatinine 1.36 on admission, now climbing to 1.65 - hydrate in setting of very poor intake - CK normal   Alcohol abuse Alcohol level 75 at time of admission - appears to be in withdrawal - cont CIWA protocol   Hyperglycemia A1c 6.3  Obesity - Body mass index is 38.56 kg/(m^2).   Code Status: FULL Family Communication: no family present at time of exam Disposition Plan: SDU   Consultants: Neuro   Procedures: EEG -  2/17 - moderate diffuse slowing (nonspecific) - no epileptiform discharges   Antibiotics: none  DVT prophylaxis: SCDs (too high risk for injury/subsequent bleeding to use pharmacologic tx)  Objective: Blood pressure 126/80, pulse 131, temperature 99.5 F (37.5 C), temperature source Axillary, resp. rate 13, height  (1.88 m), weight 136.3 kg (300 lb 7.8 oz), SpO2 96 %.  Intake/Output Summary (Last 24 hours) at 01/26/16 1615 Last data filed at 01/26/16 1305  Gross per 24 hour  Intake  89.58 ml  Output    400 ml  Net -310.42 ml   Exam: General: No acute respiratory distress evident  Lungs: Clear to auscultation bilaterally without wheezes or crackles Cardiovascular: Tachycardic but regular without appreciable murmur gallop or rub Abdomen: Nontender, obese, soft, bowel sounds positive, no rebound Extremities: No significant cyanosis, clubbing, or edema bilateral lower extremities  Data Reviewed:  Basic Metabolic Panel:  Recent Labs Lab 01/25/16 0456 01/26/16 0531  NA 142 146*  K 4.8 5.5*  CL 101 109  CO2 22 23  GLUCOSE 234* 159*  BUN 18 27*  CREATININE 1.32* 1.65*  CALCIUM 9.4 9.1    CBC:  Recent Labs Lab 01/25/16 0456 01/26/16 0531  WBC 9.8 15.5*  NEUTROABS 6.6  --   HGB 16.2 14.3  HCT 47.7 43.1  MCV 84.7 86.2  PLT 231 244    Liver Function Tests:  Recent Labs Lab 01/25/16 0456 01/26/16 0531  AST 1827* 579*  ALT 950* 591*  ALKPHOS  85 83  BILITOT 0.7 2.0*  PROT 8.2* 6.9  ALBUMIN 4.3 3.9   No results for input(s): LIPASE, AMYLASE in the last 168 hours.  Recent Labs Lab 01/25/16 0816  AMMONIA 36*   Coags: No results for input(s): INR in the last 168 hours.  Invalid input(s): PT No results for input(s): APTT in the last 168 hours.  Cardiac Enzymes:  Recent Labs Lab 01/25/16 0816  CKTOTAL 120    CBG:  Recent Labs Lab 01/25/16 1211 01/25/16 1902 01/25/16 2121 01/26/16 0840 01/26/16 1205  GLUCAP 163* 135* 135* 159* 152*      Recent Results (from the past 240 hour(s))  Culture, blood (routine x 2)     Status: None (Preliminary result)   Collection Time: 01/25/16  7:56 AM  Result Value Ref Range Status   Specimen Description BLOOD RIGHT ANTECUBITAL  Final   Special Requests BOTTLES DRAWN AEROBIC ONLY 5CC  Final   Culture NO GROWTH 1 DAY  Final   Report Status PENDING  Incomplete  Culture, blood (routine x 2)     Status: None (Preliminary result)   Collection Time: 01/25/16  8:03 AM  Result Value Ref Range Status   Specimen Description BLOOD RIGHT FOREARM  Final   Special Requests BOTTLES DRAWN AEROBIC AND ANAEROBIC 5CC  Final   Culture NO GROWTH 1 DAY  Final   Report Status PENDING  Incomplete     Studies:   Recent x-ray studies have been reviewed in detail by the Attending Physician  Scheduled Meds:  Scheduled Meds: . folic acid  1 mg Oral Daily  . heparin  5,000 Units Subcutaneous 3 times per day  . insulin aspart  0-15 Units Subcutaneous TID WC  . insulin aspart  0-5 Units Subcutaneous QHS    Time spent on care of this patient: 35 mins   MCCLUNG,JEFFREY T , MD   [facility] Office  [phone] Pager - Text Page per Loretha Stapler as per below:  On-Call/Text Page:      [email or web address]      password [email or web address]  If 7PM-7AM, please contact night-coverage [email or web address] Password [email or web address] 01/26/2016, 4:15 PM   LOS: 1 day

## 2016-01-27 DIAGNOSIS — G9340 Encephalopathy, unspecified: Secondary | ICD-10-CM

## 2016-01-27 LAB — COMPREHENSIVE METABOLIC PANEL
ALK PHOS: 78 U/L (ref 38–126)
ALT: 368 U/L — ABNORMAL HIGH (ref 17–63)
ANION GAP: 13 (ref 5–15)
AST: 265 U/L — ABNORMAL HIGH (ref 15–41)
Albumin: 3.6 g/dL (ref 3.5–5.0)
BILIRUBIN TOTAL: 1.2 mg/dL (ref 0.3–1.2)
BUN: 26 mg/dL — ABNORMAL HIGH (ref 6–20)
CALCIUM: 8.8 mg/dL — AB (ref 8.9–10.3)
CO2: 21 mmol/L — AB (ref 22–32)
Chloride: 112 mmol/L — ABNORMAL HIGH (ref 101–111)
Creatinine, Ser: 1.42 mg/dL — ABNORMAL HIGH (ref 0.61–1.24)
GFR, EST NON AFRICAN AMERICAN: 52 mL/min — AB (ref >60)
Glucose, Bld: 156 mg/dL — ABNORMAL HIGH (ref 65–99)
Potassium: 3.9 mmol/L (ref 3.5–5.1)
SODIUM: 146 mmol/L — AB (ref 135–145)
TOTAL PROTEIN: 7 g/dL (ref 6.5–8.1)

## 2016-01-27 LAB — CBC
HCT: 39.2 % (ref 39.0–52.0)
HEMOGLOBIN: 12.8 g/dL — AB (ref 13.0–17.0)
MCH: 28.1 pg (ref 26.0–34.0)
MCHC: 32.7 g/dL (ref 30.0–36.0)
MCV: 86 fL (ref 78.0–100.0)
Platelets: 172 10*3/uL (ref 150–400)
RBC: 4.56 MIL/uL (ref 4.22–5.81)
RDW: 13.2 % (ref 11.5–15.5)
WBC: 8.5 10*3/uL (ref 4.0–10.5)

## 2016-01-27 LAB — PROTIME-INR
INR: 1.3 (ref 0.00–1.49)
PROTHROMBIN TIME: 16.3 s — AB (ref 11.6–15.2)

## 2016-01-27 LAB — GLUCOSE, CAPILLARY
GLUCOSE-CAPILLARY: 140 mg/dL — AB (ref 65–99)
GLUCOSE-CAPILLARY: 124 mg/dL — AB (ref 65–99)

## 2016-01-27 LAB — LACTIC ACID, PLASMA
LACTIC ACID, VENOUS: 1.3 mmol/L (ref 0.5–2.0)
LACTIC ACID, VENOUS: 1.3 mmol/L (ref 0.5–2.0)

## 2016-01-27 LAB — MRSA PCR SCREENING: MRSA BY PCR: NEGATIVE

## 2016-01-27 MED ORDER — HALOPERIDOL LACTATE 5 MG/ML IJ SOLN
10.0000 mg | Freq: Once | INTRAMUSCULAR | Status: DC | PRN
Start: 2016-01-27 — End: 2016-01-27

## 2016-01-27 MED ORDER — THIAMINE HCL 100 MG/ML IJ SOLN
100.0000 mg | Freq: Every day | INTRAMUSCULAR | Status: DC
  Administered 2016-01-27 – 2016-01-28 (×2): 100 mg via INTRAVENOUS
  Filled 2016-01-27 (×2): qty 1

## 2016-01-27 MED ORDER — HALOPERIDOL LACTATE 5 MG/ML IJ SOLN: 10.0000 mg | INTRAMUSCULAR | Status: DC | PRN

## 2016-01-27 MED ORDER — DEXMEDETOMIDINE HCL IN NACL 400 MCG/100ML IV SOLN
0.4000 ug/kg/h | INTRAVENOUS | Status: DC
  Administered 2016-01-27: 0.7 ug/kg/h via INTRAVENOUS
  Administered 2016-01-27: 0.4 ug/kg/h via INTRAVENOUS
  Administered 2016-01-27: 0.7 ug/kg/h via INTRAVENOUS
  Administered 2016-01-27: 0.6 ug/kg/h via INTRAVENOUS
  Administered 2016-01-27 (×2): 0.7 ug/kg/h via INTRAVENOUS
  Administered 2016-01-28: 0.5 ug/kg/h via INTRAVENOUS
  Administered 2016-01-28: 0.499 ug/kg/h via INTRAVENOUS
  Administered 2016-01-28: 0.4 ug/kg/h via INTRAVENOUS
  Administered 2016-01-29: 0.2 ug/kg/h via INTRAVENOUS
  Administered 2016-01-29: 0.4 ug/kg/h via INTRAVENOUS
  Filled 2016-01-27 (×6): qty 100
  Filled 2016-01-27: qty 50
  Filled 2016-01-27 (×4): qty 100

## 2016-01-27 MED ORDER — HALOPERIDOL LACTATE 5 MG/ML IJ SOLN
10.0000 mg | Freq: Once | INTRAMUSCULAR | Status: AC
  Administered 2016-01-27: 10 mg via INTRAVENOUS

## 2016-01-27 MED ORDER — LORAZEPAM 2 MG/ML IJ SOLN: 3.0000 mg | Freq: Once | INTRAMUSCULAR | Status: AC

## 2016-01-27 MED ORDER — HALOPERIDOL LACTATE 5 MG/ML IJ SOLN
INTRAMUSCULAR | Status: AC
  Filled 2016-01-27: qty 1

## 2016-01-27 NOTE — Progress Notes (Signed)
Chart reviewed, patient seen early am by CCM Fellow - admitted after witnessed seizure at home in the setting of ETOH withdraw.  Seen by Neurology.  Developed worsening agitation despite ativan administration, Tx to ICU on 2/19.  Labs reviewed with improvement.  Lactic acid cleared. Tachycardia resolved, normotensive.  Patient resting comfortably in bed on precedex.     Plan: Follow up BMP / CBC in am Continue precedex as ordered  Frequent reorientation Continue to hold antihypertensives for now Thiamine / folate     Canary Brim, NP-C [facility] Pgr: [phone] or if no answer [phone] 01/27/2016, 10:48 AM

## 2016-01-27 NOTE — Plan of Care (Signed)
Problem: Self-Concept: Goal: Level of anxiety will decrease Outcome: Not Progressing Variance: Slow or unresponsive to therapy    Goal: Ability to verbalize feelings about condition will improve Outcome: Not Progressing Variance: Slow or unresponsive to therapy Comments: CIWA still very high

## 2016-01-27 NOTE — Consult Note (Signed)
PULMONARY / CRITICAL CARE MEDICINE   Name: Aaron Norman MRN: [id number] DOB: 1956-06-16    ADMISSION DATE:  01/25/2016 CONSULTATION DATE:  01/27/16  REFERRING MD:  Dr. Sharon Seller   CHIEF COMPLAINT:  Alcohol withdrawal  HISTORY OF PRESENT ILLNESS:   Aaron Norman is a 60 y/o man who was admitted on 2/17 after having a witnessed seizure at home. He was evaluated by neurology and seizure was thought to be brought on by alcohol withdrawal.  Since admission he has become increasingly agitated.  He is on the CIWA protocol and has received over  ativan in the last 24hours, despite this he continues .  PCCM was asked to evaluate for transfer to the ICU to initiate a precedex infusion.     PAST MEDICAL HISTORY :  He  has a past medical history of Alcohol abuse; Obesity; Seizure (HCC); Heart murmur; and Hypothyroidism.  PAST SURGICAL HISTORY: He  has past surgical history that includes Gastric bypass.  Allergies  Allergen Reactions  . Hydrocodone-Acetaminophen     Hallucinations    No current facility-administered medications on file prior to encounter.   No current outpatient prescriptions on file prior to encounter.    FAMILY HISTORY:  His has no family status information on file.   SOCIAL HISTORY: He  reports that he drinks alcohol.  REVIEW OF SYSTEMS:   Review of systems could not be obtained as pt is altered  SUBJECTIVE:  60 y/o man who presented with seizures now with vital sign changes and agitation 2/2 to alcohol withdrawal, not controlled by benzos and haldol  VITAL SIGNS: BP 180/94 mmHg  Pulse 134  Temp(Src) 100.8 F (38.2 C) (Axillary)  Resp 31  Ht  (1.88 m)  Wt 136.3 kg (300 lb 7.8 oz)  BMI 38.56 kg/m2  SpO2 97%  HEMODYNAMICS:    VENTILATOR SETTINGS:    INTAKE / OUTPUT: I/O last 3 completed shifts: In: 675.4 [I.V.:675.4] Out: 400 [Urine:400]  PHYSICAL EXAMINATION: General:  Laying in bed, fidgeting, mitten restraints in place  Neuro:   altered HEENT:  PERRL, EOMI, OP clear Cardiovascular:  Tachycardic to 130s, RR, no MRG Lungs:  CTAB, no wrr Abdomen:  Soft, NT, normal BS Musculoskeletal:  No edema Skin:  No rashes or other lesions  LABS:  BMET  Recent Labs Lab 01/25/16 0456 01/26/16 0531 01/27/16 0246  NA 142 146* 146*  K 4.8 5.5* 3.9  CL 101 109 112*  CO2 22 23 21*  BUN 18 27* 26*  CREATININE 1.32* 1.65* 1.42*  GLUCOSE 234* 159* 156*    Electrolytes  Recent Labs Lab 01/25/16 0456 01/26/16 0531 01/27/16 0246  CALCIUM 9.4 9.1 8.8*    CBC  Recent Labs Lab 01/25/16 0456 01/26/16 0531 01/27/16 0246  WBC 9.8 15.5* 8.5  HGB 16.2 14.3 12.8*  HCT 47.7 43.1 39.2  PLT 231 244 172    Coag's  Recent Labs Lab 01/27/16 0246  INR 1.30    Sepsis Markers  Recent Labs Lab 01/25/16 0525 01/25/16 1153 01/27/16 0246  LATICACIDVEN 7.37* 3.5* 1.3    ABG  Recent Labs Lab 01/25/16 1113  PHART 7.481*  PCO2ART 31.7*  PO2ART 65.0*    Liver Enzymes  Recent Labs Lab 01/25/16 0456 01/26/16 0531 01/27/16 0246  AST 1827* 579* 265*  ALT 950* 591* 368*  ALKPHOS 85 83 78  BILITOT 0.7 2.0* 1.2  ALBUMIN 4.3 3.9 3.6    Cardiac Enzymes No results for input(s): TROPONINI, PROBNP in the last 168 hours.  Glucose  Recent Labs Lab 01/25/16 1902 01/25/16 2121 01/26/16 0840 01/26/16 1205 01/26/16 1604 01/26/16 2110  GLUCAP 135* 135* 159* 152* 147* 140*    Imaging No results found.    DISCUSSION: 60 y/o man with alcohol withdrawal.  Transferred to ICU for precedex gtt  ASSESSMENT / PLAN:  CARDIOVASCULAR A:  Tachycardia, hypertension P:  Is on antihypertensives as outpt. Continue to hold for now pending response to precedex  GI: Hepatitis P: Likely 2/2 to alcohol, LFTs trending down  ENDOCRINE A:   Hypothyroidism   P:   Replacement  NEUROLOGIC A:   Alcohol withdrawal P:   RASS goal: 0 Start precedex gtt   FAMILY  - Updates: sister at bedside -  Inter-disciplinary family meet or Palliative Care meeting due by:  day 7  I spent 35 minutes of critical care time in the care of this patient seperate from procedures which are documented elsewhere    Sandi Carne MD, PhD Pulmonary and Critical Care Medicine [facility] Pager: [phone]  01/27/2016, 4:14 AM

## 2016-01-28 DIAGNOSIS — F10931 Alcohol use, unspecified with withdrawal delirium: Secondary | ICD-10-CM | POA: Insufficient documentation

## 2016-01-28 DIAGNOSIS — F10231 Alcohol dependence with withdrawal delirium: Secondary | ICD-10-CM | POA: Insufficient documentation

## 2016-01-28 LAB — CBC
HCT: 38.1 % — ABNORMAL LOW (ref 39.0–52.0)
HEMOGLOBIN: 12.1 g/dL — AB (ref 13.0–17.0)
MCH: 27.6 pg (ref 26.0–34.0)
MCHC: 31.8 g/dL (ref 30.0–36.0)
MCV: 86.8 fL (ref 78.0–100.0)
Platelets: 140 10*3/uL — ABNORMAL LOW (ref 150–400)
RBC: 4.39 MIL/uL (ref 4.22–5.81)
RDW: 13.2 % (ref 11.5–15.5)
WBC: 10.4 10*3/uL (ref 4.0–10.5)

## 2016-01-28 LAB — HEPATIC FUNCTION PANEL
ALK PHOS: 78 U/L (ref 38–126)
ALT: 231 U/L — AB (ref 17–63)
AST: 118 U/L — ABNORMAL HIGH (ref 15–41)
Albumin: 3.3 g/dL — ABNORMAL LOW (ref 3.5–5.0)
BILIRUBIN DIRECT: 0.4 mg/dL (ref 0.1–0.5)
BILIRUBIN INDIRECT: 0.6 mg/dL (ref 0.3–0.9)
BILIRUBIN TOTAL: 1 mg/dL (ref 0.3–1.2)
TOTAL PROTEIN: 7.1 g/dL (ref 6.5–8.1)

## 2016-01-28 LAB — C DIFFICILE QUICK SCREEN W PCR REFLEX
C DIFFICLE (CDIFF) ANTIGEN: NEGATIVE
C Diff interpretation: NEGATIVE
C Diff toxin: NEGATIVE

## 2016-01-28 LAB — BASIC METABOLIC PANEL
ANION GAP: 12 (ref 5–15)
BUN: 22 mg/dL — ABNORMAL HIGH (ref 6–20)
CALCIUM: 8.6 mg/dL — AB (ref 8.9–10.3)
CHLORIDE: 114 mmol/L — AB (ref 101–111)
CO2: 20 mmol/L — AB (ref 22–32)
Creatinine, Ser: 1.26 mg/dL — ABNORMAL HIGH (ref 0.61–1.24)
GFR calc Af Amer: >60 mL/min (ref >60)
GFR calc non Af Amer: >60 mL/min (ref >60)
GLUCOSE: 138 mg/dL — AB (ref 65–99)
Potassium: 3.7 mmol/L (ref 3.5–5.1)
Sodium: 146 mmol/L — ABNORMAL HIGH (ref 135–145)

## 2016-01-28 LAB — FOLATE RBC
FOLATE, RBC: 991 ng/mL (ref >498)
Folate, Hemolysate: 441 ng/mL
Hematocrit: 44.5 % (ref 37.5–51.0)

## 2016-01-28 LAB — MAGNESIUM: MAGNESIUM: 2.2 mg/dL (ref 1.7–2.4)

## 2016-01-28 MED ORDER — VITAMIN B-1 100 MG PO TABS
100.0000 mg | ORAL_TABLET | Freq: Every day | ORAL | Status: DC
  Administered 2016-01-29 – 2016-01-30 (×2): 100 mg via ORAL
  Filled 2016-01-28 (×2): qty 1

## 2016-01-28 MED ORDER — LORAZEPAM 2 MG/ML IJ SOLN
2.0000 mg | Freq: Three times a day (TID) | INTRAMUSCULAR | Status: DC
  Administered 2016-01-28 – 2016-01-29 (×3): 2 mg via INTRAVENOUS
  Filled 2016-01-28 (×3): qty 1

## 2016-01-28 MED ORDER — FOLIC ACID 1 MG PO TABS
1.0000 mg | ORAL_TABLET | Freq: Every day | ORAL | Status: DC
  Administered 2016-01-29 – 2016-01-30 (×2): 1 mg via ORAL
  Filled 2016-01-28 (×2): qty 1

## 2016-01-28 MED ORDER — CARBAMAZEPINE 200 MG PO TABS
200.0000 mg | ORAL_TABLET | Freq: Two times a day (BID) | ORAL | Status: DC
  Administered 2016-01-28 – 2016-01-30 (×4): 200 mg via ORAL
  Filled 2016-01-28 (×4): qty 1

## 2016-01-28 NOTE — Progress Notes (Signed)
PULMONARY / CRITICAL CARE MEDICINE   Name: Gawain Crombie MRN: [id number] DOB: Dec 03, 1956    ADMISSION DATE:  01/25/2016 CONSULTATION DATE:  01/27/16  REFERRING MD:  Dr. Sharon Seller   CHIEF COMPLAINT:  Alcohol withdrawal  HISTORY OF PRESENT ILLNESS:   Mr. Bostic is a 60 y/o man who was admitted on 2/17 after having a witnessed seizure at home. He was evaluated by neurology and seizure was thought to be brought on by alcohol withdrawal.  Since admission he has become increasingly agitated.  He is on the CIWA protocol and has received over  ativan in the last 24hours, despite this he continues.  PCCM was asked to evaluate for transfer to the ICU to initiate a precedex infusion.     SUBJECTIVE:  Awake and responsive No complaints Did well overnight  VITAL SIGNS: BP 108/77 mmHg  Pulse 78  Temp(Src) 97.5 F (36.4 C) (Oral)  Resp 21  Ht  (1.88 m)  Wt 304 lb 10.8 oz (138.2 kg)  BMI 39.10 kg/m2  SpO2 99%  HEMODYNAMICS:    VENTILATOR SETTINGS:    INTAKE / OUTPUT: I/O last 3 completed shifts: In: 3219.8 [I.V.:3219.8] Out: 1130 [Urine:1130]  PHYSICAL EXAMINATION:  General:  Laying in bed, NAD, awake Neuro: A&Ox3, non-focal HEENT:  PERRL, EOMI, OP clear Cardiovascular:  RRR, no MRG Lungs:  CTAB, no wrr, normal wob Abdomen:  Soft, NT, normal BS Musculoskeletal:  No edema Skin:  No rashes or other lesions  LABS:  BMET  Recent Labs Lab 01/26/16 0531 01/27/16 0246 01/28/16 0246  NA 146* 146* 146*  K 5.5* 3.9 3.7  CL 109 112* 114*  CO2 23 21* 20*  BUN 27* 26* 22*  CREATININE 1.65* 1.42* 1.26*  GLUCOSE 159* 156* 138*    Electrolytes  Recent Labs Lab 01/26/16 0531 01/27/16 0246 01/28/16 0246  CALCIUM 9.1 8.8* 8.6*    CBC  Recent Labs Lab 01/26/16 0531 01/27/16 0246 01/28/16 0246  WBC 15.5* 8.5 10.4  HGB 14.3 12.8* 12.1*  HCT 43.1 39.2 38.1*  PLT 244 172 140*    Coag's  Recent Labs Lab 01/27/16 0246  INR 1.30    Sepsis  Markers  Recent Labs Lab 01/25/16 1153 01/27/16 0246 01/27/16 0542  LATICACIDVEN 3.5* 1.3 1.3    ABG  Recent Labs Lab 01/25/16 1113  PHART 7.481*  PCO2ART 31.7*  PO2ART 65.0*    Liver Enzymes  Recent Labs Lab 01/26/16 0531 01/27/16 0246 01/28/16 0246  AST 579* 265* 118*  ALT 591* 368* 231*  ALKPHOS 83 78 78  BILITOT 2.0* 1.2 1.0  ALBUMIN 3.9 3.6 3.3*    Cardiac Enzymes No results for input(s): TROPONINI, PROBNP in the last 168 hours.  Glucose  Recent Labs Lab 01/26/16 0840 01/26/16 1205 01/26/16 1604 01/26/16 2110 01/27/16 0438 01/27/16 1924  GLUCAP 159* 152* 147* 140* 140* 124*    Imaging No results found.  DISCUSSION: 60 y/o man with alcohol withdrawal.  Transferred to ICU for precedex gtt.   ASSESSMENT / PLAN:  CARDIOVASCULAR A:  Tachycardia, hypertension - Resolved P:  Hold home antihypertensives On precedex; wean to off  GI: Hepatitis P: Likely 2/2 to alcohol, LFTs trending down Start diet No indication for PPI  Infectious: A:  No acute issues P: Follow BCx( 01/20/16)- NGTD  Hold any Abx  Collect C.diff per nurse worries (3 loose stools in 24/hrs; smell pos)  NEUROLOGIC A:   Alcohol withdrawal P:   RASS goal: 0 continue precedex gtt; wean Haldol and ativan  prn Follow CIWA- continues to be 4  FAMILY  - Updates: No one at bedside  Caryl Ada, DO 01/28/2016, 10:10 AM PGY-2, [facility]

## 2016-01-28 NOTE — Progress Notes (Signed)
UR Completed. Floreine Kingdon, RN, BSN.  [phone] 

## 2016-01-28 NOTE — Care Management Note (Addendum)
Case Management Note  Patient Details  Name: Aaron Norman MRN: [id number] Date of Birth: [birth year]  Subjective/Objective:           Pt admitted with acute encephalopathy and seizures, CIWA protocol - on IV Precedex         Action/Plan:  CSW consulted for substance abuse.  Per bedside nurse pt is from home with sister.  CM attempted to contact sister but was unable to reach her.  CM will continue to follow pt for discharge needs   Expected Discharge Date:                  Expected Discharge Plan:     In-House Referral:  Clinical Social Work  Discharge planning Services     Post Acute Care Choice:    Choice offered to:     DME Arranged:    DME Agency:     HH Arranged:    HH Agency:     Status of Service:  In process, will continue to follow  Medicare Important Message Given:    Date Medicare IM Given:    Medicare IM give by:    Date Additional Medicare IM Given:    Additional Medicare Important Message give by:     If discussed at Long Length of Stay Meetings, dates discussed:    Additional Comments:  Cherylann Parr, RN 01/28/2016, 1:56 PM

## 2016-01-29 LAB — CBC
HEMATOCRIT: 39 % (ref 39.0–52.0)
Hemoglobin: 12.6 g/dL — ABNORMAL LOW (ref 13.0–17.0)
MCH: 28 pg (ref 26.0–34.0)
MCHC: 32.3 g/dL (ref 30.0–36.0)
MCV: 86.7 fL (ref 78.0–100.0)
Platelets: 134 10*3/uL — ABNORMAL LOW (ref 150–400)
RBC: 4.5 MIL/uL (ref 4.22–5.81)
RDW: 13.3 % (ref 11.5–15.5)
WBC: 7.1 10*3/uL (ref 4.0–10.5)

## 2016-01-29 LAB — BASIC METABOLIC PANEL
Anion gap: 10 (ref 5–15)
BUN: 20 mg/dL (ref 6–20)
CHLORIDE: 113 mmol/L — AB (ref 101–111)
CO2: 21 mmol/L — ABNORMAL LOW (ref 22–32)
Calcium: 8.4 mg/dL — ABNORMAL LOW (ref 8.9–10.3)
Creatinine, Ser: 1.28 mg/dL — ABNORMAL HIGH (ref 0.61–1.24)
GFR calc Af Amer: >60 mL/min (ref >60)
GFR calc non Af Amer: 59 mL/min — ABNORMAL LOW (ref >60)
GLUCOSE: 132 mg/dL — AB (ref 65–99)
POTASSIUM: 3.8 mmol/L (ref 3.5–5.1)
Sodium: 144 mmol/L (ref 135–145)

## 2016-01-29 MED ORDER — LORAZEPAM 2 MG/ML IJ SOLN
2.0000 mg | Freq: Four times a day (QID) | INTRAMUSCULAR | Status: DC
  Administered 2016-01-29 – 2016-01-30 (×4): 2 mg via INTRAVENOUS
  Filled 2016-01-29 (×4): qty 1

## 2016-01-29 NOTE — Progress Notes (Signed)
PULMONARY / CRITICAL CARE MEDICINE   Name: Aaron Norman MRN: [id number] DOB: 02-Apr-1956    ADMISSION DATE:  01/25/2016 CONSULTATION DATE:  01/27/16  REFERRING MD:  Dr. Sharon Seller   CHIEF COMPLAINT:  Alcohol withdrawal  HISTORY OF PRESENT ILLNESS:   Aaron Norman is a 60 y/o man who was admitted on 2/17 after having a witnessed seizure at home. He was evaluated by neurology and seizure was thought to be brought on by alcohol withdrawal.  Since admission he has become increasingly agitated.  He is on the CIWA protocol and has received over  ativan in the last 24hours, despite this he continues.  PCCM was asked to evaluate for transfer to the ICU to initiate a precedex infusion.     SUBJECTIVE:  Awake and responsive No complaints Did well overnight Still on precedex; was not weaned yesterday  VITAL SIGNS: BP 119/94 mmHg  Pulse 80  Temp(Src) 98.2 F (36.8 C) (Oral)  Resp 23  Ht  (1.88 m)  Wt 304 lb 10.8 oz (138.2 kg)  BMI 39.10 kg/m2  SpO2 97%  HEMODYNAMICS:    VENTILATOR SETTINGS:    INTAKE / OUTPUT: I/O last 3 completed shifts: In: 3162.2 [I.V.:3162.2] Out: 1043 [Urine:1042; Stool:1]  PHYSICAL EXAMINATION:  General:  Laying in bed, NAD, awake Neuro: A&Ox3, non-focal, follows commands, speech fluent HEENT:  PERRL, EOMI, OP clear Cardiovascular:  RRR, no MRG Lungs:  CTAB, no wrr, normal wob Abdomen:  Soft, NT, normal BS Musculoskeletal:  No edema Skin:  No rashes or other lesions  LABS:  BMET  Recent Labs Lab 01/27/16 0246 01/28/16 0246 01/29/16 0303  NA 146* 146* 144  K 3.9 3.7 3.8  CL 112* 114* 113*  CO2 21* 20* 21*  BUN 26* 22* 20  CREATININE 1.42* 1.26* 1.28*  GLUCOSE 156* 138* 132*    Electrolytes  Recent Labs Lab 01/27/16 0246 01/28/16 0246 01/29/16 0303  CALCIUM 8.8* 8.6* 8.4*  MG  --  2.2  --     CBC  Recent Labs Lab 01/27/16 0246 01/28/16 0246 01/29/16 0303  WBC 8.5 10.4 7.1  HGB 12.8* 12.1* 12.6*  HCT 39.2 38.1* 39.0   PLT 172 140* 134*    Coag's  Recent Labs Lab 01/27/16 0246  INR 1.30    Sepsis Markers  Recent Labs Lab 01/25/16 1153 01/27/16 0246 01/27/16 0542  LATICACIDVEN 3.5* 1.3 1.3    ABG  Recent Labs Lab 01/25/16 1113  PHART 7.481*  PCO2ART 31.7*  PO2ART 65.0*    Liver Enzymes  Recent Labs Lab 01/26/16 0531 01/27/16 0246 01/28/16 0246  AST 579* 265* 118*  ALT 591* 368* 231*  ALKPHOS 83 78 78  BILITOT 2.0* 1.2 1.0  ALBUMIN 3.9 3.6 3.3*    Cardiac Enzymes No results for input(s): TROPONINI, PROBNP in the last 168 hours.  Glucose  Recent Labs Lab 01/26/16 0840 01/26/16 1205 01/26/16 1604 01/26/16 2110 01/27/16 0438 01/27/16 1924  GLUCAP 159* 152* 147* 140* 140* 124*    Imaging No results found.  DISCUSSION: 60 y/o man with alcohol withdrawal.  Transferred to ICU for precedex gtt.   ASSESSMENT / PLAN:  CARDIOVASCULAR A:  Tachycardia, hypertension - Resolved P:  Hold home antihypertensives On precedex; wean to off  GI: Hepatitis P: Likely 2/2 to alcohol, LFTs trending down Continue diet No indication for PPI  Infectious: A:  No acute issues P: Follow BCx( 01/20/16)- NGTD  Hold any Abx  C. Diff neg  NEUROLOGIC A:   Alcohol withdrawal P:  RASS goal: 0 continue precedex gtt; wean to off today Ativan  scheduled every 6 hrs Haldol prn Follow CIWA- 0,1  Seizure precautions  FAMILY  - Updates: No one at bedside  Caryl Ada, DO 01/29/2016, 7:27 AM PGY-2, [facility]

## 2016-01-29 NOTE — Progress Notes (Signed)
CCM resident, Aaron Norman, called regarding discontinuation of foley and  clarification of IVF rate. Continue current therapies for now.

## 2016-01-29 NOTE — Progress Notes (Signed)
Patient transferred from ICU. Report received from [employer].  Patient transferred via wheelchair, no complaint of pain or discomfort.  Patient reports only belongings to be his cell phone and charger. Phone and call bell within reach, instruction on how to use them were given and bed in the lowest position, bed rails up x2 and bed alarm set.

## 2016-01-29 NOTE — Progress Notes (Signed)
ICU UR Completed. Grayden Burley, RN, BSN.  [phone]  

## 2016-01-30 LAB — CULTURE, BLOOD (ROUTINE X 2)
Culture: NO GROWTH
Culture: NO GROWTH

## 2016-01-30 MED ORDER — LORAZEPAM 0.5 MG PO TABS: 0.5000 mg | ORAL_TABLET | Freq: Three times a day (TID) | ORAL | Status: AC | PRN

## 2016-01-30 NOTE — Clinical Social Work Note (Signed)
Clinical Social Work Assessment  Patient Details  Name: Aaron Norman MRN: [id number] Date of Birth: Sep 03, 1956  Date of referral:  01/30/16               Reason for consult:  Substance Use/ETOH Abuse                Permission sought to share information with:    Permission granted to share information::  No  Name::        Agency::     Relationship::     Contact Information:     Housing/Transportation Living arrangements for the past 2 months:  Apartment Source of Information:  Patient Patient Interpreter Needed:  None Criminal Activity/Legal Involvement Pertinent to Current Situation/Hospitalization:  No - Comment as needed Significant Relationships:  Siblings Lives with:  Siblings Do you feel safe going back to the place where you live?  Yes Need for family participation in patient care:  Yes (Comment)  Care giving concerns:  Patient does not report any care giving concerns at this time.   Social Worker assessment / plan:  CSW met with patient at bedside to complete assessment. CSW assessed patient for his alcohol use and to provide resource information to patient. The patient is very limited in the information he provides CSW about hit alcohol use. He states that he drinks "about a half a pint of Shearon Stalls" when he does drink. The patient is would not disclose how many days per week he drinks and just continued to state, "I'm done with alcohol." The patient appears to be unrealistic about the extent of his use and his ability to just stop drinking given his past BAC levels of 438 and 331. CSW completed SBIRT with patient and offered substance abuse resources to the patient but he declined.  Employment status:  Unemployed Forensic scientist:  Self Pay (Medicaid Pending) PT Recommendations:  24 Hour Supervision, [place] / Referral to community resources:  SBIRT, Other (Comment Required) (Outpatient and residential treatment options offered to  patient but patient declined.)  Patient/Family's Response to care:  Patient appears happy with the care he has received.  Patient/Family's Understanding of and Emotional Response to Diagnosis, Current Treatment, and Prognosis:  The patient appears to have a fair understanding of the reason for his admission and his post DC needs. He appears minimally motivated to discuss his alcohol use and engage in a treatment program at this time.   Emotional Assessment Appearance:  Appears stated age Attitude/Demeanor/Rapport:  Other (Patient is welcoming of CSW and appropriate.) Affect (typically observed):  Accepting, Calm, Appropriate, Pleasant Orientation:  Oriented to Self, Oriented to Place, Oriented to  Time, Oriented to Situation Alcohol / Substance use:  Alcohol Use (He states, "I drink half a pint a day of Barnabas Lister [employer]'s.") Psych involvement (Current and /or in the community):  No (Comment)  Discharge Needs  Concerns to be addressed:  Substance Abuse Concerns Readmission within the last 30 days:  No Current discharge risk:  Substance Abuse Barriers to Discharge:  No Barriers Identified   Rigoberto Noel, LCSW 01/30/2016, 3:48 PM

## 2016-01-30 NOTE — Progress Notes (Signed)
Nsg Discharge Note  Admit Date:  01/25/2016 Discharge date: 01/30/2016   Randal Goens to be D/C'd Home with home health per MD order.  AVS completed.  Copy for chart, and copy for patient signed, and dated. Patient/caregiver able to verbalize understanding.  Discharge Medication:   Medication List    TAKE these medications        acetaminophen 500 MG tablet  Commonly known as:  TYLENOL  Take 1,000 mg by mouth 2 (two) times daily.     atorvastatin 20 MG tablet  Commonly known as:  LIPITOR  Take 20 mg by mouth daily at 6 PM.     carbamazepine 200 MG tablet  Commonly known as:  TEGRETOL  Take 200 mg by mouth 2 (two) times daily.     diphenhydramine-acetaminophen 25-500 MG Tabs tablet  Commonly known as:  TYLENOL PM  Take 1 tablet by mouth at bedtime as needed (sleep).     furosemide 20 MG tablet  Commonly known as:  LASIX  Take 20 mg by mouth daily.     hydrochlorothiazide 25 MG tablet  Commonly known as:  HYDRODIURIL  Take 25 mg by mouth daily.     levothyroxine 50 MCG tablet  Commonly known as:  SYNTHROID, LEVOTHROID  Take 50 mcg by mouth daily before breakfast.     lisinopril 20 MG tablet  Commonly known as:  PRINIVIL,ZESTRIL  Take 20 mg by mouth daily.     LORazepam 0.5 MG tablet  Commonly known as:  ATIVAN  Take 1 tablet (0.5 mg total) by mouth every 8 (eight) hours as needed for anxiety.     multivitamin with minerals Tabs tablet  Take 1 tablet by mouth daily.     thiamine 100 MG tablet  Commonly known as:  VITAMIN B-1  Take 100 mg by mouth daily.        Discharge Assessment: Filed Vitals:   01/30/16 0616 01/30/16 1435  BP: 147/82 156/87  Pulse: 90 103  Temp: 98.7 F (37.1 C) 98.4 F (36.9 C)  Resp: 16 16   Skin clean, dry and intact without evidence of skin break down, no evidence of skin tears noted. IV catheter discontinued intact. Site without signs and symptoms of complications - no redness or edema noted at insertion site, patient denies  c/o pain - only slight tenderness at site.  Dressing with slight pressure applied.  D/c Instructions-Education: Discharge instructions given to patient/family with verbalized understanding. D/c education completed with patient/family including follow up instructions, medication list, d/c activities limitations if indicated, with other d/c instructions as indicated by MD - patient able to verbalize understanding, all questions fully answered. Patient instructed to return to ED, call 911, or call MD for any changes in condition.  Patient escorted via WC, and D/C home via private auto, sister came and picked up patient.   Kern Reap, RN 01/30/2016 5:35 PM

## 2016-01-30 NOTE — Care Management Note (Signed)
Case Management Note  Patient Details  Name: Aaron Norman MRN: [id number] Date of Birth: 05-Jan-1956  Subjective/Objective:                 Patient transferred to unit last night from 32M. DC today. Patient states that he lives with his sister. That she drives him to his appointments. 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 Patient states that he receives all his medications through the [place] and has no difficulties obtaining them. No new medications on discharge observed.    Action/Plan:  Patient to schedule follow up at [facility], no CM needs identified.  Expected Discharge Date:                  Expected Discharge Plan:  Home/Self Care  In-House Referral:  Clinical Social Work  Discharge planning Services  CM Consult  Post Acute Care Choice:  NA Choice offered to:     DME Arranged:    DME Agency:     HH Arranged:    HH Agency:     Status of Service:  Completed, signed off  Medicare Important Message Given:    Date Medicare IM Given:    Medicare IM give by:    Date Additional Medicare IM Given:    Additional Medicare Important Message give by:     If discussed at Long Length of Stay Meetings, dates discussed:    Additional Comments:  Aaron Sabal, RN 01/30/2016, 11:17 AM

## 2016-01-30 NOTE — Discharge Instructions (Signed)
Follow with No primary care provider on file. in 5-7 days ° °Please get a complete blood count and chemistry panel checked by your Primary MD at your next visit, and again as instructed by your Primary MD. Please get your medications reviewed and adjusted by your Primary MD. ° °Please request your Primary MD to go over all Hospital Tests and Procedure/Radiological results at the follow up, please get all Hospital records sent to your Prim MD by signing hospital release before you go home. ° °If you had Pneumonia of Lung problems at the Hospital: °Please get a 2 view Chest X ray done in 6-8 weeks after hospital discharge or sooner if instructed by your Primary MD. ° °If you have Congestive Heart Failure: °Please call your Cardiologist or Primary MD anytime you have any of the following symptoms:  °1) 3 pound weight gain in 24 hours or 5 pounds in 1 week  °2) shortness of breath, with or without a dry hacking cough  °3) swelling in the hands, feet or stomach  °4) if you have to sleep on extra pillows at night in order to breathe ° °Follow cardiac low salt diet and 1.5 lit/day fluid restriction. ° °If you have diabetes °Accuchecks 4 times/day, Once in AM empty stomach and then before each meal. °Log in all results and show them to your primary doctor at your next visit. °If any glucose reading is under 80 or above 300 call your primary MD immediately. ° °If you have Seizure/Convulsions/Epilepsy: °Please do not drive, operate heavy machinery, participate in activities at heights or participate in high speed sports until you have seen by Primary MD or a Neurologist and advised to do so again. ° °If you had Gastrointestinal Bleeding: °Please ask your Primary MD to check a complete blood count within one week of discharge or at your next visit. Your endoscopic/colonoscopic biopsies that are pending at the time of discharge, will also need to followed by your Primary MD. ° °Get Medicines reviewed and adjusted. °Please take  all your medications with you for your next visit with your Primary MD ° °Please request your Primary MD to go over all hospital tests and procedure/radiological results at the follow up, please ask your Primary MD to get all Hospital records sent to his/her office. ° °If you experience worsening of your admission symptoms, develop shortness of breath, life threatening emergency, suicidal or homicidal thoughts you must seek medical attention immediately by calling 911 or calling your MD immediately  if symptoms less severe. ° °You must read complete instructions/literature along with all the possible adverse reactions/side effects for all the Medicines you take and that have been prescribed to you. Take any new Medicines after you have completely understood and accpet all the possible adverse reactions/side effects.  ° °Do not drive or operate heavy machinery when taking Pain medications.  ° °Do not take more than prescribed Pain, Sleep and Anxiety Medications ° °Special Instructions: If you have smoked or chewed Tobacco  in the last 2 yrs please stop smoking, stop any regular Alcohol  and or any Recreational drug use. ° °Wear Seat belts while driving. ° °Please note °You were cared for by a hospitalist during your hospital stay. If you have any questions about your discharge medications or the care you received while you were in the hospital after you are discharged, you can call the unit and asked to speak with the hospitalist on call if the hospitalist that took care of you is   not available. Once you are discharged, your primary care physician will handle any further medical issues. Please note that NO REFILLS for any discharge medications will be authorized once you are discharged, as it is imperative that you return to your primary care physician (or establish a relationship with a primary care physician if you do not have one) for your aftercare needs so that they can reassess your need for medications and  monitor your lab values. ° °You can reach the hospitalist office at phone [phone] or fax [phone] °  °If you do not have a primary care physician, you can call [phone] for a physician referral. ° °Activity: As tolerated with Full fall precautions use walker/cane & assistance as needed ° °Diet: regular ° °Disposition Home ° ° °

## 2016-01-30 NOTE — Progress Notes (Signed)
CM just informed by MD that patient will now require [facility] PT at discharge. [facility] with [facility] for [facility] of [facility] services.

## 2016-01-30 NOTE — Discharge Summary (Signed)
Physician Discharge Summary  Arihant Pennings ZOX:[id number] DOB: June 28, 1956 DOA: 01/25/2016   Admit date: 01/25/2016 Discharge date: 01/30/2016  Time spent: > 30 minutes  Recommendations for Outpatient Follow-up:  1. Follow up with PCP in 1-2 weeks   Discharge Diagnoses:  Principal Problem:   Acute encephalopathy Active Problems:   Seizure (HCC)   Tachycardia   Obesity   ETOH abuse   Acute kidney injury (HCC)   Elevated lactic acid level   Elevated transaminase level   Hyperglycemia   Alcohol withdrawal delirium [facility])  Discharge Condition: guarded, patient refusing SNF  Diet recommendation: Regular  Filed Weights   01/25/16 1715 01/28/16 0400 01/29/16 2149  Weight: 136.3 kg (300 lb 7.8 oz) 138.2 kg (304 lb 10.8 oz) 136.8 kg (301 lb 9.4 oz)    History of present illness:  See H&P, Labs, Consult and Test reports for all details in brief, patient is a 60 year old male admitted on 2/17 with seizures in the setting of alcohol withdrawal.  Hospital Course:  Seizures - likely in the setting of alcohol withdrawal, resolved Alcohol abuse - patient has been drinking heavily over the last 3 months, was initially admitted to ICU and was on Precedex, this was successfully weaned off, he was transferred to the floor and has remained stable, no longer triggering CIWA. Social work was consulted to assist with outpatient resources. Patient with poor balance on ambulation, physical therapy evaluated patient and recommended SNF, however patient refused and very adamant about returning home. Home health PT was arranged by care management prior to discharge. Alcoholic hepatitis - LFTs elevated in the pattern consistent with alcohol abuse, improved. Hyperlipidemia Hypothyroidism - resume Synthroid Hypertension  Procedures:  None    Consultations:  PCCM  Neurology   Discharge Exam: Filed Vitals:   01/29/16 2100 01/29/16 2149 01/30/16 0616 01/30/16 1435  BP: 155/87 159/108 147/82  156/87  Pulse: 103 111 90 103  Temp:  98.2 F (36.8 C) 98.7 F (37.1 C) 98.4 F (36.9 C)  TempSrc:  Oral Oral Oral  Resp: Height:   (1.854 m)    Weight:  136.8 kg (301 lb 9.4 oz)    SpO2: 96% 100% 99% 99%    General: NAD Cardiovascular: RRR Respiratory: CTA biL  Discharge Instructions Activity:  As tolerated   Get Medicines reviewed and adjusted: Please take all your medications with you for your next visit with your Primary MD  Please request your Primary MD to go over all hospital tests and procedure/radiological results at the follow up, please ask your Primary MD to get all Hospital records sent to his/her office.  If you experience worsening of your admission symptoms, develop shortness of breath, life threatening emergency, suicidal or homicidal thoughts you must seek medical attention immediately by calling 911 or calling your MD immediately if symptoms less severe.  You must read complete instructions/literature along with all the possible adverse reactions/side effects for all the Medicines you take and that have been prescribed to you. Take any new Medicines after you have completely understood and accpet all the possible adverse reactions/side effects.   Do not drive when taking Pain medications.   Do not take more than prescribed Pain, Sleep and Anxiety Medications  Special Instructions: If you have smoked or chewed Tobacco in the last 2 yrs please stop smoking, stop any regular Alcohol and or any Recreational drug use.  Wear Seat belts while driving.  Please note  You were cared for by  a hospitalist during your hospital stay. Once you are discharged, your primary care physician will handle any further medical issues. Please note that NO REFILLS for any discharge medications will be authorized once you are discharged, as it is imperative that you return to your primary care physician (or establish a relationship with a primary care physician  if you do not have one) for your aftercare needs so that they can reassess your need for medications and monitor your lab values.    Medication List    TAKE these medications        acetaminophen 500 MG tablet  Commonly known as:  TYLENOL  Take 1,000 mg by mouth 2 (two) times daily.     atorvastatin 20 MG tablet  Commonly known as:  LIPITOR  Take 20 mg by mouth daily at 6 PM.     carbamazepine 200 MG tablet  Commonly known as:  TEGRETOL  Take 200 mg by mouth 2 (two) times daily.     diphenhydramine-acetaminophen 25-500 MG Tabs tablet  Commonly known as:  TYLENOL PM  Take 1 tablet by mouth at bedtime as needed (sleep).     furosemide 20 MG tablet  Commonly known as:  LASIX  Take 20 mg by mouth daily.     hydrochlorothiazide 25 MG tablet  Commonly known as:  HYDRODIURIL  Take 25 mg by mouth daily.     levothyroxine 50 MCG tablet  Commonly known as:  SYNTHROID, LEVOTHROID  Take 50 mcg by mouth daily before breakfast.     lisinopril 20 MG tablet  Commonly known as:  PRINIVIL,ZESTRIL  Take 20 mg by mouth daily.     LORazepam 0.5 MG tablet  Commonly known as:  ATIVAN  Take 1 tablet (0.5 mg total) by mouth every 8 (eight) hours as needed for anxiety.     multivitamin with minerals Tabs tablet  Take 1 tablet by mouth daily.     thiamine 100 MG tablet  Commonly known as:  VITAMIN B-1  Take 100 mg by mouth daily.           Follow-up Information    Follow up with PCP at [facility]. Go on 02/05/2016.   Why:  2:30.   Contact information:   Dr Loleta Books      Follow up with Lenn Sink GI MD. Nyra Capes on 02/01/2016.   Why:  at 1:30      Follow up with Medication.   Contact information:   FAXED Rx to Dr Asencion Partridge office to sign. It will be filled at [facility] pharmacy.      Follow up with [facility].   Why:  HH PT to begin 1-2 days after discharge   Contact information:   [address] [place] [address] [phone]       The  results of significant diagnostics from this hospitalization (including imaging, microbiology, ancillary and laboratory) are listed below for reference.    Significant Diagnostic Studies: Ct Head Wo Contrast  01/25/2016  CLINICAL DATA:  Seizure today. No history of seizures. Nonresponsive. History of alcohol abuse. EXAM: CT HEAD WITHOUT CONTRAST CT CERVICAL SPINE WITHOUT CONTRAST TECHNIQUE: Multidetector CT imaging of the head and cervical spine was performed following the standard protocol without intravenous contrast. Multiplanar CT image reconstructions of the cervical spine were also generated. COMPARISON:  None. FINDINGS: CT HEAD FINDINGS The ventricles and sulci are normal. No intraparenchymal hemorrhage, mass effect nor midline shift. No acute large vascular territory infarcts.  No abnormal extra-axial fluid collections. Basal cisterns are patent. Mild calcific atherosclerosis of the carotid siphons. No skull fracture. The included ocular globes and orbital contents are non-suspicious. Old RIGHT medial orbital blowout fracture. Mild paranasal sinus mucosal thickening. CT CERVICAL SPINE FINDINGS Large body habitus results in overall noisy image quality. Cervical vertebral bodies intact and aligned with maintenance of cervical lordosis. Moderate disc C6-7 disc height loss, with irregular endplates most compatible with Schmorl's nodes, sclerosis and spurring. Multilevel mild facet arthropathy. C1-2 articulation maintained. Medial course of LEFT Common carotid artery, normal variant. IMPRESSION: CT HEAD: Negative noncontrast CT head for age. CT CERVICAL SPINE: Straightened cervical lordosis without acute fracture or malalignment. Electronically Signed   By: Awilda Metro M.D.   On: 01/25/2016 04:50   Ct Cervical Spine Wo Contrast  01/25/2016  CLINICAL DATA:  Seizure today. No history of seizures. Nonresponsive. History of alcohol abuse. EXAM: CT HEAD WITHOUT CONTRAST CT CERVICAL SPINE WITHOUT CONTRAST  TECHNIQUE: Multidetector CT imaging of the head and cervical spine was performed following the standard protocol without intravenous contrast. Multiplanar CT image reconstructions of the cervical spine were also generated. COMPARISON:  None. FINDINGS: CT HEAD FINDINGS The ventricles and sulci are normal. No intraparenchymal hemorrhage, mass effect nor midline shift. No acute large vascular territory infarcts. No abnormal extra-axial fluid collections. Basal cisterns are patent. Mild calcific atherosclerosis of the carotid siphons. No skull fracture. The included ocular globes and orbital contents are non-suspicious. Old RIGHT medial orbital blowout fracture. Mild paranasal sinus mucosal thickening. CT CERVICAL SPINE FINDINGS Large body habitus results in overall noisy image quality. Cervical vertebral bodies intact and aligned with maintenance of cervical lordosis. Moderate disc C6-7 disc height loss, with irregular endplates most compatible with Schmorl's nodes, sclerosis and spurring. Multilevel mild facet arthropathy. C1-2 articulation maintained. Medial course of LEFT Common carotid artery, normal variant. IMPRESSION: CT HEAD: Negative noncontrast CT head for age. CT CERVICAL SPINE: Straightened cervical lordosis without acute fracture or malalignment. Electronically Signed   By: Awilda Metro M.D.   On: 01/25/2016 04:50    Microbiology: Recent Results (from the past 240 hour(s))  Culture, blood (routine x 2)     Status: None   Collection Time: 01/25/16  7:56 AM  Result Value Ref Range Status   Specimen Description BLOOD RIGHT ANTECUBITAL  Final   Special Requests BOTTLES DRAWN AEROBIC ONLY 5CC  Final   Culture NO GROWTH 5 DAYS  Final   Report Status 01/30/2016 FINAL  Final  Culture, blood (routine x 2)     Status: None   Collection Time: 01/25/16  8:03 AM  Result Value Ref Range Status   Specimen Description BLOOD RIGHT FOREARM  Final   Special Requests BOTTLES DRAWN AEROBIC AND ANAEROBIC  5CC  Final   Culture NO GROWTH 5 DAYS  Final   Report Status 01/30/2016 FINAL  Final  MRSA PCR Screening     Status: None   Collection Time: 01/26/16  9:21 PM  Result Value Ref Range Status   MRSA by PCR NEGATIVE NEGATIVE Final    Comment:        The GeneXpert MRSA Assay (FDA approved for NASAL specimens only), is one component of a comprehensive MRSA colonization surveillance program. It is not intended to diagnose MRSA infection nor to guide or monitor treatment for MRSA infections.   C difficile quick scan w PCR reflex     Status: None   Collection Time: 01/28/16 10:36 AM  Result Value Ref Range Status  C Diff antigen NEGATIVE NEGATIVE Final   C Diff toxin NEGATIVE NEGATIVE Final   C Diff interpretation Negative for toxigenic C. difficile  Final     Labs: Basic Metabolic Panel:  Recent Labs Lab 01/25/16 0456 01/26/16 0531 01/27/16 0246 01/28/16 0246 01/29/16 0303  NA 142 146* 146* 146* 144  K 4.8 5.5* 3.9 3.7 3.8  CL 101 109 112* 114* 113*  CO2 22 23 21* 20* 21*  GLUCOSE 234* 159* 156* 138* 132*  BUN 18 27* 26* 22* 20  CREATININE 1.32* 1.65* 1.42* 1.26* 1.28*  CALCIUM 9.4 9.1 8.8* 8.6* 8.4*  MG  --   --   --  2.2  --    Liver Function Tests:  Recent Labs Lab 01/25/16 0456 01/26/16 0531 01/27/16 0246 01/28/16 0246  AST 1827* 579* 265* 118*  ALT 950* 591* 368* 231*  ALKPHOS 85 83 78 78  BILITOT 0.7 2.0* 1.2 1.0  PROT 8.2* 6.9 7.0 7.1  ALBUMIN 4.3 3.9 3.6 3.3*    Recent Labs Lab 01/25/16 0816  AMMONIA 36*   CBC:  Recent Labs Lab 01/25/16 0456 01/25/16 1153 01/26/16 0531 01/27/16 0246 01/28/16 0246 01/29/16 0303  WBC 9.8  --  15.5* 8.5 10.4 7.1  NEUTROABS 6.6  --   --   --   --   --   HGB 16.2  --  14.3 12.8* 12.1* 12.6*  HCT 47.7 44.5 43.1 39.2 38.1* 39.0  MCV 84.7  --  86.2 86.0 86.8 86.7  PLT 231  --  244 172 140* 134*   Cardiac Enzymes:  Recent Labs Lab 01/25/16 0816  CKTOTAL 120   CBG:  Recent Labs Lab 01/26/16 1205  01/26/16 1604 01/26/16 2110 01/27/16 0438 01/27/16 1924  GLUCAP 152* 147* 140* 140* 124*       Signed:  Antaniya Venuti  [facility] 01/30/2016, 5:40 PM

## 2016-01-30 NOTE — Progress Notes (Signed)
Spoke with Dr Shon Hale. She approved HH PT, and [facility] accepted referral. Dr Loleta Books verified fax number and notified that rx was on its way for Ativan and that patient will pick up at [facility] pharmacy. Original Rx left with patient in DC folder. Patient aware to pick up Rx at [facility]

## 2016-01-30 NOTE — Progress Notes (Signed)
CSW received consult for SNF placement- pt is not an appropriate candidate and plan is for pt to return home with home health.  CSW spoke with RNCM who is already aware and has set up home health services  CSW signing off.  Merlyn Lot, LCSWA Clinical Social Worker [phone]

## 2016-01-30 NOTE — Progress Notes (Addendum)
CSW consulted for transportation home.  Pt sister has been called multiple times without answer- pt reports she works 3 jobs and cannot come- no other family.  Pt is not able to go by bus due to weakness/ being unsteady  CSW provided RN with taxi voucher- will call when pt is ready for DC  CSW signing off  Merlyn Lot, [facility] Clinical Social Worker [phone]

## 2016-01-30 NOTE — Evaluation (Signed)
Physical Therapy Evaluation Patient Details Name: Aaron Norman MRN: [id number] DOB: 1956/04/02 Today's Date: 01/30/2016   History of Present Illness  60 y.o. male admitted after witnessed seizure at home in the setting of ETOH withdrawal.  Clinical Impression  Pt admitted with above diagnosis. Pt currently with functional limitations due to the deficits listed below (see PT Problem List). Demonstrates decreased safety awareness, and significant instability with gait. Impulsive and not following instructions for safer mobility. States his sister works during the day and cannot take off work to assist him at home. Eager to leave today. Will follow and progress until D/c.     Follow Up Recommendations SNF;Supervision/Assistance - 24 hour (If pt refuses, recommend HHPT and max supervision)    Equipment Recommendations  None recommended by PT    Recommendations for Other Services       Precautions / Restrictions Precautions Precautions: Fall Restrictions Weight Bearing Restrictions: No      Mobility  Bed Mobility               General bed mobility comments: sitting in recliner  Transfers Overall transfer level: Needs assistance Equipment used: None Transfers: Sit to/from Stand Sit to Stand: Min guard         General transfer comment: Impulsive to get out of chair despite PT stating pt needs to put leg rest down. Unsteady upon standing, holding onto windowseal. Performed several times, pt pulling apart chair alarm despite cues to leave it alone.  Ambulation/Gait Ambulation/Gait assistance: Min guard Ambulation Distance (Feet): 200 Feet Assistive device: Rolling walker (2 wheeled);None Gait Pattern/deviations: Step-through pattern;Decreased stride length;Staggering left;Staggering right;Drifts right/left;Trunk flexed Gait velocity: decreased Gait velocity interpretation: Below normal speed for age/gender General Gait Details: In room pt bumping into objects, furniture  walking. Provided RW for support, improves balance however pt unsafely using device, running into and over objects despite instructions for use and safety.  Stairs Stairs: Yes Stairs assistance: Min guard Stair Management: Two rails;Alternating pattern;Step to pattern;Forwards Number of Stairs: 12 General stair comments: Educated on safer sequencing pattern however pt did not listen. Navigates leaning heavily forward, with alternating step pattern to ascend and step-to for descent. Close guard for safety. Ignoring cues.  Wheelchair Mobility    Modified Rankin (Stroke Patients Only)       Balance Overall balance assessment: Needs assistance Sitting-balance support: No upper extremity supported;Feet supported Sitting balance-Leahy Scale: Good     Standing balance support: No upper extremity supported Standing balance-Leahy Scale: Fair                               Pertinent Vitals/Pain Pain Assessment: No/denies pain    Home Living Family/patient expects to be discharged to:: Private residence Living Arrangements: Other relatives (sister) Available Help at Discharge: Family (sister works all day) Type of Home: House Home Access: Stairs to enter Entrance Stairs-Rails: Doctor, general practice of Steps: 14 Home Layout: One level Home Equipment: Environmental consultant - 2 wheels      Prior Function Level of Independence: Independent with assistive device(s)         Comments: uses RW for mobility     Hand Dominance        Extremity/Trunk Assessment   Upper Extremity Assessment: Defer to OT evaluation           Lower Extremity Assessment: Difficult to assess due to impaired cognition         Communication   Communication:  No difficulties  Cognition Arousal/Alertness: Awake/alert Behavior During Therapy: Impulsive Overall Cognitive Status: No family/caregiver present to determine baseline cognitive functioning Area of Impairment: Following  commands;Safety/judgement;Awareness;Problem solving     Memory: Decreased recall of precautions;Decreased short-term memory Following Commands: Follows one step commands inconsistently;Follows one step commands with increased time Safety/Judgement: Decreased awareness of safety;Decreased awareness of deficits Awareness: Emergent Problem Solving: Slow processing;Difficulty sequencing;Requires verbal cues;Requires tactile cues      General Comments      Exercises        Assessment/Plan    PT Assessment Patient needs continued PT services  PT Diagnosis Difficulty walking;Abnormality of gait;Generalized weakness   PT Problem List Decreased activity tolerance;Decreased balance;Decreased mobility;Decreased coordination;Decreased cognition;Decreased knowledge of use of DME;Decreased safety awareness;Decreased knowledge of precautions;Obesity  PT Treatment Interventions DME instruction;Gait training;Stair training;Functional mobility training;Therapeutic activities;Therapeutic exercise;Balance training;Cognitive remediation;Patient/family education;Neuromuscular re-education   PT Goals (Current goals can be found in the Care Plan section) Acute Rehab PT Goals Patient Stated Goal: Go home now PT Goal Formulation: With patient Time For Goal Achievement: 02/13/16 Potential to Achieve Goals: Fair    Frequency Min 3X/week   Barriers to discharge Decreased caregiver support alone during the day    Co-evaluation               End of Session Equipment Utilized During Treatment: Gait belt Activity Tolerance: Patient tolerated treatment well Patient left: in chair;with call bell/phone within reach;with chair alarm set           Time: 4098-1191 PT Time Calculation (min) (ACUTE ONLY): 14 min   Charges:   PT Evaluation $PT Eval Low Complexity: 1 Procedure     PT G CodesBerton Mount 01/30/2016, 2:14 PM  Sunday [place],  [phone]

## 2016-04-16 ENCOUNTER — Encounter (HOSPITAL_COMMUNITY): Payer: Self-pay | Admitting: Psychiatry

## 2016-04-16 ENCOUNTER — Ambulatory Visit (INDEPENDENT_AMBULATORY_CARE_PROVIDER_SITE_OTHER): Payer: Non-veteran care | Admitting: Psychiatry

## 2016-04-16 VITALS — BP 142/80 | HR 92 | Ht 73.0 in | Wt 313.0 lb

## 2016-04-16 DIAGNOSIS — F1010 Alcohol abuse, uncomplicated: Secondary | ICD-10-CM | POA: Diagnosis not present

## 2016-04-16 MED ORDER — NALTREXONE HCL 50 MG PO TABS
50.0000 mg | ORAL_TABLET | Freq: Every day | ORAL | Status: DC
Start: 2016-04-16 — End: 2017-02-06
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
## 2016-07-06 IMAGING — CT CT HEAD W/O CM
5 of 6 series · 18 of 47 positions shown, 19 images · non-contrast
Comparison: None.

CLINICAL DATA: Seizure today. No history of seizures.
Nonresponsive. History of alcohol abuse.

EXAM:
CT HEAD WITHOUT CONTRAST
CT CERVICAL SPINE WITHOUT CONTRAST
TECHNIQUE: Multidetector CT imaging of the head and cervical spine was
performed following the standard protocol without intravenous
contrast. Multiplanar CT image reconstructions of the cervical spine
were also generated.

[Series 2: head without · axial · non-contrast · 0.46mm/px · z∈[-141,+29]mm · 3 of 35 slices shown, 4 images]
[im 1/35  brain]
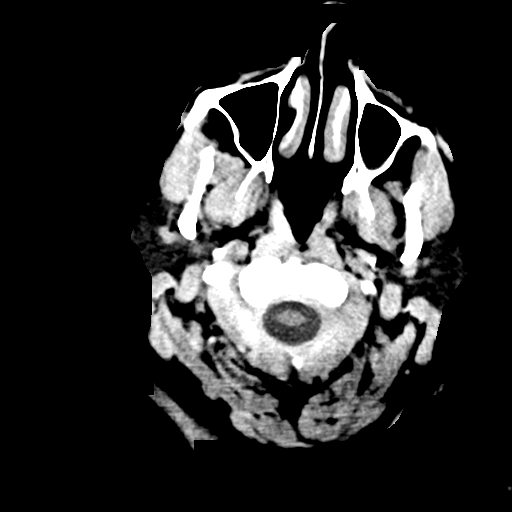
[im 1/35  bone]
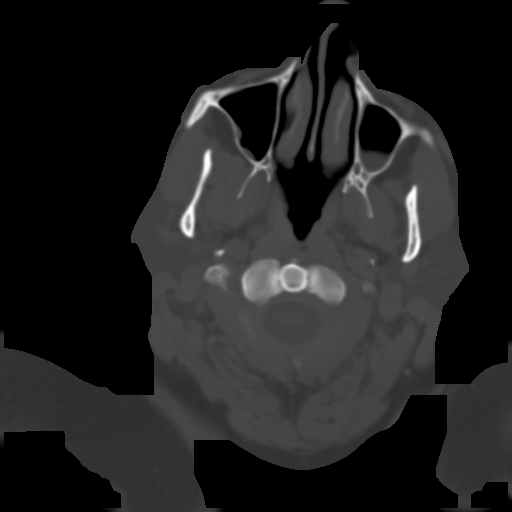
[im 18/35  brain]
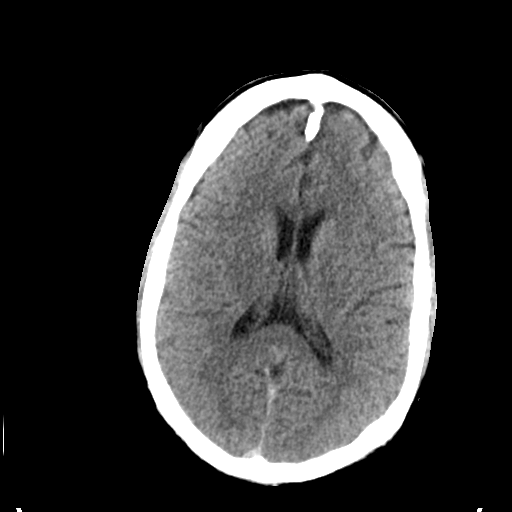
[im 35/35  brain]
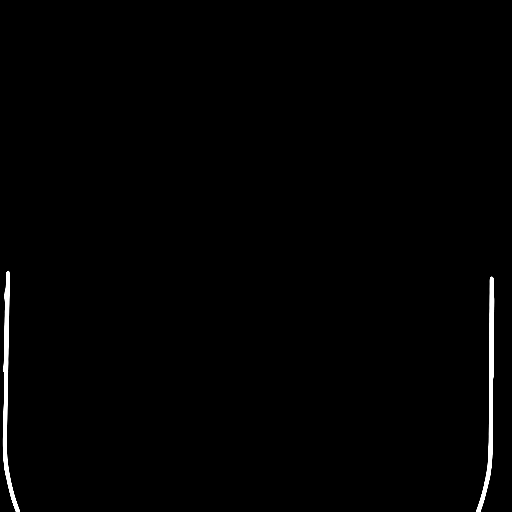

[Series 3: head bone · axial · 0.46mm/px · z∈[-121,+7]mm · 7 of 86 slices shown]
[im 11/86  bone]
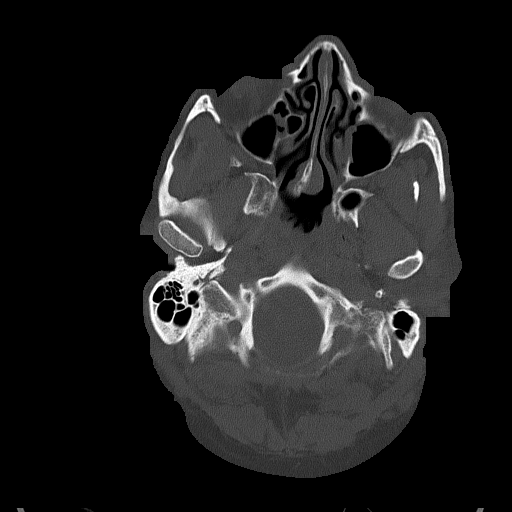
[im 22/86  bone]
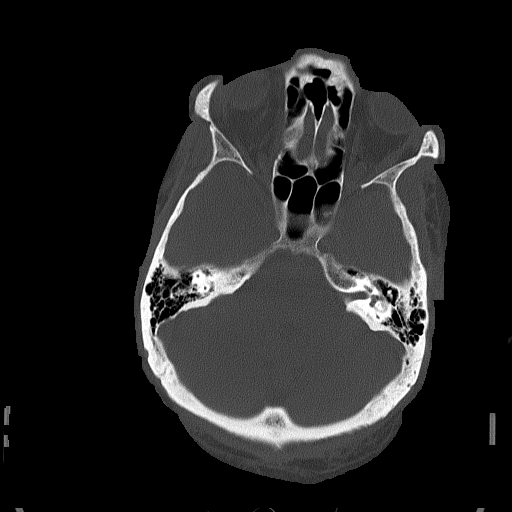
[im 32/86  bone]
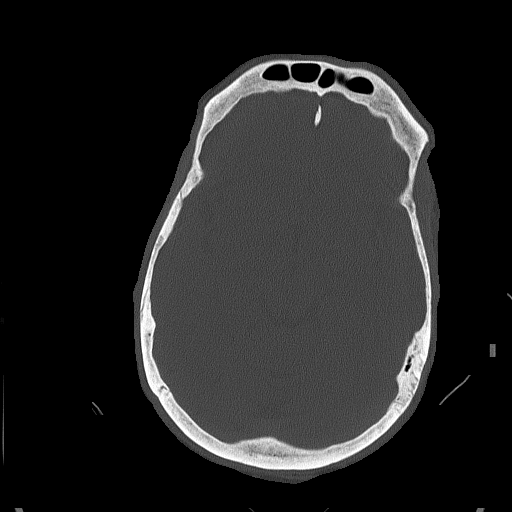
[im 43/86  bone]
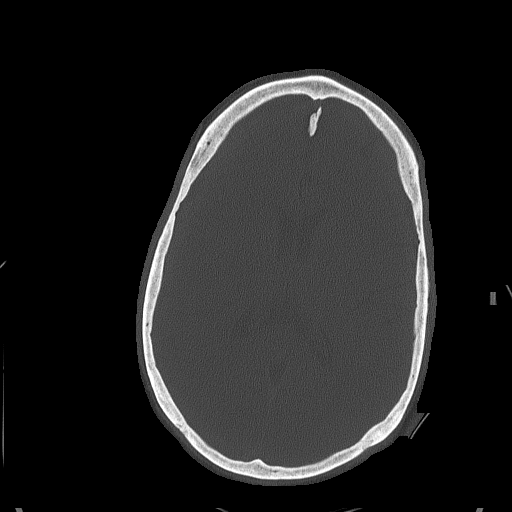
[im 54/86  bone]
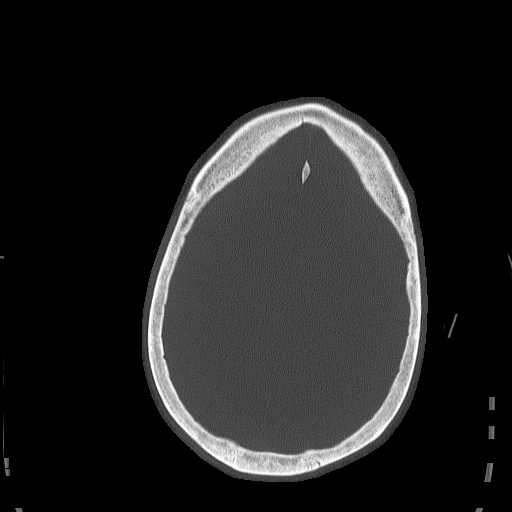
[im 64/86  bone]
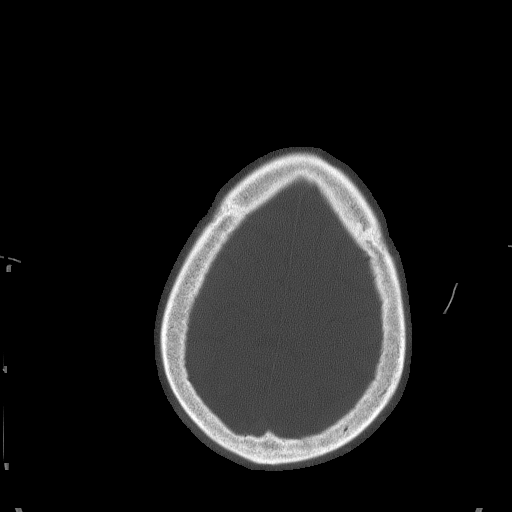
[im 75/86  bone]
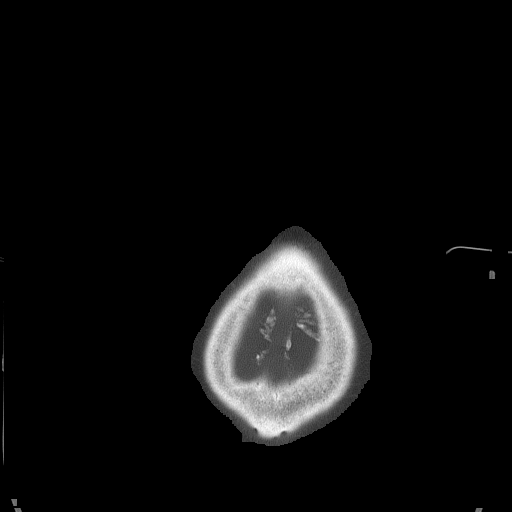

[Series 5: c_spine 2.0 st · axial · 0.29mm/px · z∈[-270,-250]mm · 2 of 84 slices shown]
[im 11/84  brain]
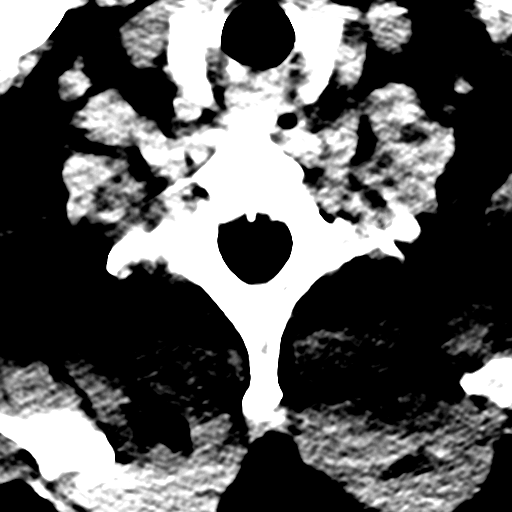
[im 21/84  brain]
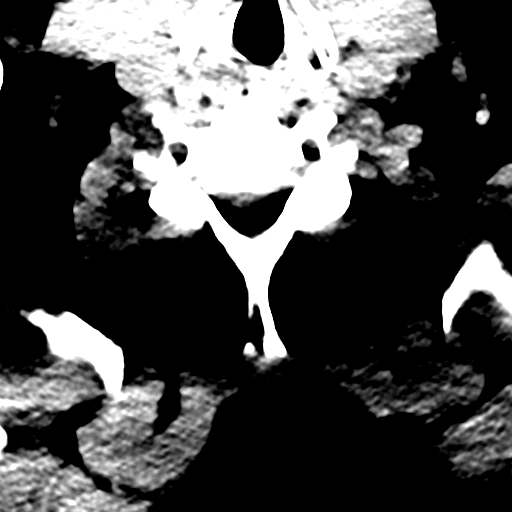

[Series 8: c_spine 2.0 cor bone · coronal · 0.30mm/px · 3 of 61 slices shown]
[im 21/61  brain]
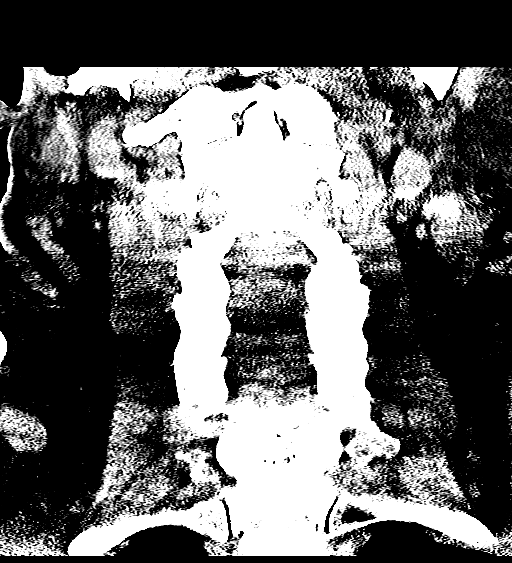
[im 27/61  brain]
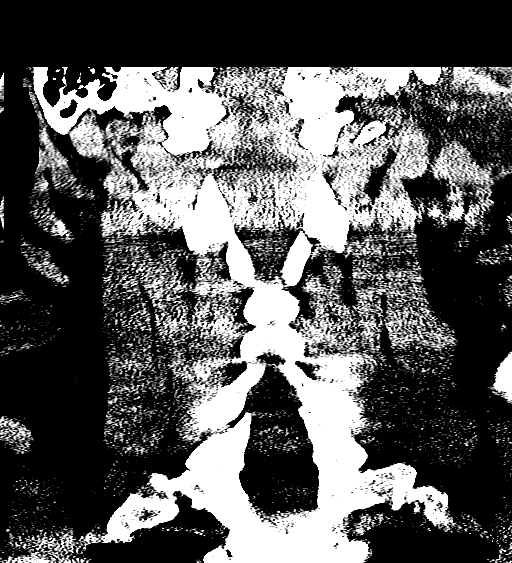
[im 34/61  brain]
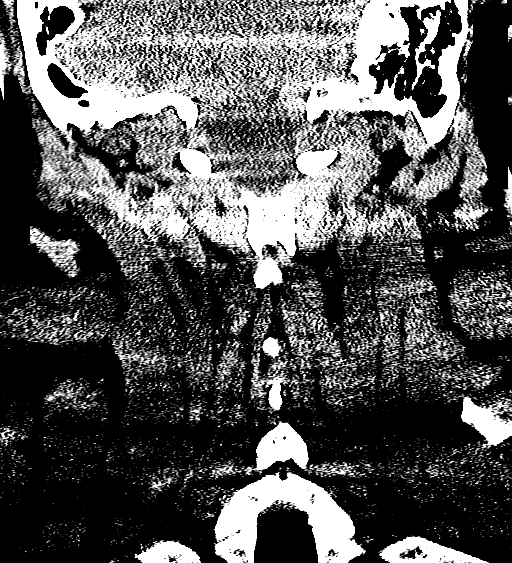

[Series 602: sag · sagittal · 0.33mm/px · 3 of 56 slices shown]
[im 19/56  brain]
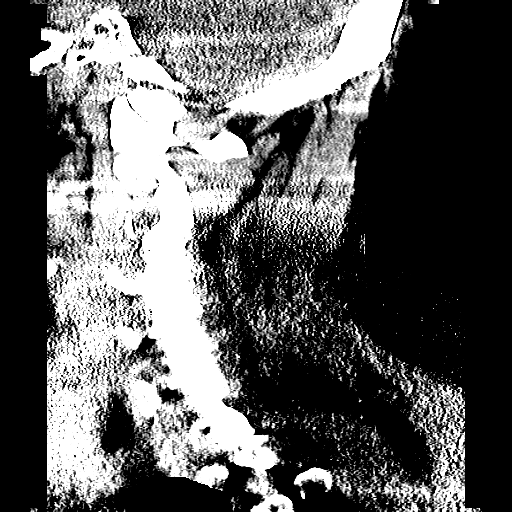
[im 28/56  brain]
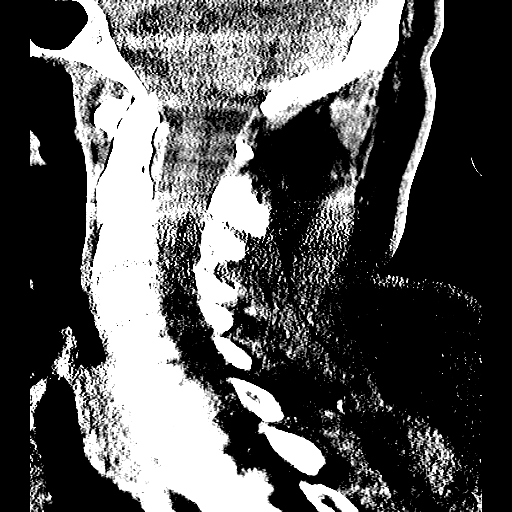
[im 37/56  brain]
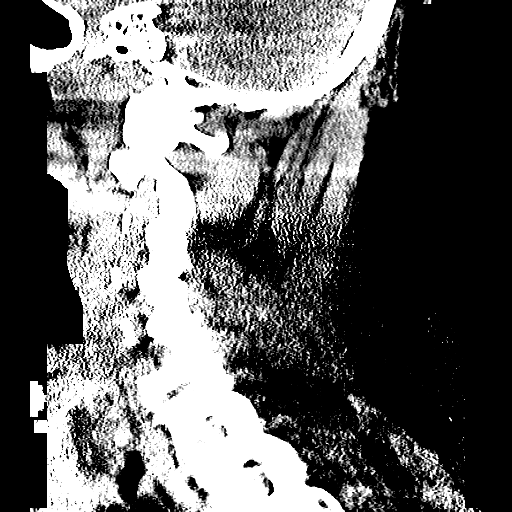

[18 of 47 positions shown; findings below may reference images not displayed]

FINDINGS: CT HEAD FINDINGS

The ventricles and sulci are normal. No intraparenchymal hemorrhage,
mass effect nor midline shift. No acute large vascular territory
infarcts.

No abnormal extra-axial fluid collections. Basal cisterns are
patent. Mild calcific atherosclerosis of the carotid siphons.

No skull fracture. The included ocular globes and orbital contents
are non-suspicious. Old RIGHT medial orbital blowout fracture. Mild
paranasal sinus mucosal thickening.

CT CERVICAL SPINE FINDINGS

Large body habitus results in overall noisy image quality. Cervical
vertebral bodies intact and aligned with maintenance of cervical
lordosis. Moderate disc C6-7 disc height loss, with irregular
endplates most compatible with Schmorl's nodes, sclerosis and
spurring. Multilevel mild facet arthropathy. C1-2 articulation
maintained. Medial course of LEFT Common carotid artery, normal
variant.
IMPRESSION: CT HEAD: Negative noncontrast CT head for age.

CT CERVICAL SPINE: Straightened cervical lordosis without acute
fracture or malalignment.

## 2016-07-18 ENCOUNTER — Ambulatory Visit (INDEPENDENT_AMBULATORY_CARE_PROVIDER_SITE_OTHER): Payer: Non-veteran care | Admitting: Psychiatry

## 2016-07-18 DIAGNOSIS — F1010 Alcohol abuse, uncomplicated: Secondary | ICD-10-CM | POA: Diagnosis not present

## 2016-07-18 DIAGNOSIS — F1011 Alcohol abuse, in remission: Secondary | ICD-10-CM

## 2016-07-18 NOTE — Progress Notes (Signed)
Psychiatric Initial Adult Assessment   Patient Identification: Davied Nocito MRN:  [id number] Date of Evaluation:  07/18/2016 [facility] Chief Complaint:   alcohol problem Visit Diagnosis: Alcohol use disorder At this time this patient came 20 minutes late for a 30 minute visit. Got a short times being with it but actually is doing very well. He's taking naltrexone as we prescribed and is getting it from the [place]. He is not drinking any all. He feels much better. His mood is good. He still trying to get his driver's license back. The patient continues taking all the other medication she's on and is actually doing really well. He denies depression. He denies anxiety. He is sleeping and eating well. He denies the use of alcohol or drugs at this time. Is positive and optimistic.  Associated Signs/Symptoms: Depression Symptoms:   (Hypo) Manic Symptoms:   Anxiety Symptoms:   Psychotic Symptoms:   PTSD Symptoms:   Past Psychiatric History: The patient has not had any psychiatric hospitalizations but has been in multiple alcohol rehabilitation inpatient settings.  Previous Psychotropic Medications:   Substance Abuse History in the last 12 months:  Yes.    Consequences of Substance Abuse: Blackouts  Past Medical History:  Past Medical History:  Diagnosis Date  . Alcohol abuse   . Heart murmur   . Hypothyroidism   . Obesity   . Seizure [facility])     Past Surgical History:  Procedure Laterality Date  . FRACTURE SURGERY    . GASTRIC BYPASS      Family Psychiatric History:   Family History: No family history on file.  Social History:   Social History   Social History  . Marital status: Single    Spouse name: N/A  . Number of children: N/A  . Years of education: N/A   Social History Main Topics  . Smoking status: Never Smoker  . Smokeless tobacco: Never Used  . Alcohol use Yes     Comment: heavily  . Drug use: No  . Sexual activity: Not on file   Other  Topics Concern  . Not on file   Social History Narrative  . No narrative on file    Additional Social History:   Allergies:   Allergies  Allergen Reactions  . Hydrocodone-Acetaminophen     Hallucinations    Metabolic Disorder Labs: Lab Results  Component Value Date   HGBA1C 6.3 (H) 01/25/2016   MPG 134 01/25/2016   No results found for: PROLACTIN No results found for: CHOL, TRIG, HDL, CHOLHDL, VLDL, LDLCALC   Current Medications: Current Outpatient Prescriptions  Medication Sig Dispense Refill  . acetaminophen (TYLENOL) 500 MG tablet Take 1,000 mg by mouth 2 (two) times daily.    Marland Kitchen atorvastatin (LIPITOR) 20 MG tablet Take 20 mg by mouth daily at 6 PM.    . carbamazepine (TEGRETOL) 200 MG tablet Take 200 mg by mouth 2 (two) times daily.    . diphenhydramine-acetaminophen (TYLENOL PM) 25-500 MG TABS tablet Take 1 tablet by mouth at bedtime as needed (sleep).    . furosemide (LASIX) 20 MG tablet Take 20 mg by mouth daily.    . hydrochlorothiazide (HYDRODIURIL) 25 MG tablet Take 25 mg by mouth daily.    Marland Kitchen levothyroxine (SYNTHROID, LEVOTHROID) 50 MCG tablet Take 50 mcg by mouth daily before breakfast.    . lisinopril (PRINIVIL,ZESTRIL) 20 MG tablet Take 20 mg by mouth daily.    Marland Kitchen LORazepam (ATIVAN) 0.5 MG tablet Take 1 tablet (0.5 mg  total) by mouth every 8 (eight) hours as needed for anxiety. 15 tablet 0  . Multiple Vitamin (MULTIVITAMIN WITH MINERALS) TABS tablet Take 1 tablet by mouth daily.    . naltrexone (DEPADE) 50 MG tablet Take 1 tablet (50 mg total) by mouth daily. 30 tablet 3  . thiamine (VITAMIN B-1) 100 MG tablet Take 100 mg by mouth daily.     No current facility-administered medications for this visit.     Neurologic: Headache: No Seizure: No Paresthesias:No  Musculoskeletal: Strength & Muscle Tone: within normal limits Gait & Station: Has a cane but walks otherwise normal Patient leans: N/A  Psychiatric Specialty Exam: ROS  There were no vitals  taken for this visit.There is no height or weight on file to calculate BMI.  General Appearance: Fairly Groomed  Eye Contact:  Good  Speech:  Clear and Coherent  Volume:  Normal  Mood:  Euthymic  Affect:  Appropriate  Thought Process:  Coherent  Orientation:  Full (Time, Place, and Person)  Thought Content:  WDL  Suicidal Thoughts:  No  Homicidal Thoughts:  No  Memory:  NA  Judgement:  Good  Insight:  Good  Psychomotor Activity:  Normal  Concentration:  Good  Recall:  Fair  Fund of Knowledge:Good  Language: Good  Akathisia:  No  Handed:  Right  AIMS (if indicated):    Assets:  Desire for Improvement  ADL's:  Intact  Cognition: WNL  Sleep:      Treatment Plan Summary: At this time the patient continue taking naltrexone 50 mg [facility]. He essentially missed today's appointment but we got to check in and make sure he was stable which she is. Is not suicidal not homicidal functioning well importantly not drinking any alcohol or using any drugs or no return to see me in 5 months.  Lucas MallowGerald Irving Kaseem Vastine, MD 8/11/201712:00 PM Patient ID: Jerald KiefJimmie Dozier, male   DOB: 06/06/1956, 60 y.o.   MRN: [id number]

## 2016-07-31 ENCOUNTER — Ambulatory Visit (HOSPITAL_COMMUNITY): Payer: Self-pay | Admitting: Psychiatry

## 2016-12-17 ENCOUNTER — Ambulatory Visit (HOSPITAL_COMMUNITY): Payer: Self-pay | Admitting: Psychiatry

## 2017-02-06 ENCOUNTER — Encounter (HOSPITAL_COMMUNITY): Payer: Self-pay | Admitting: Psychiatry

## 2017-02-06 ENCOUNTER — Ambulatory Visit (INDEPENDENT_AMBULATORY_CARE_PROVIDER_SITE_OTHER): Payer: No Typology Code available for payment source | Admitting: Psychiatry

## 2017-02-06 ENCOUNTER — Other Ambulatory Visit (HOSPITAL_COMMUNITY): Payer: Self-pay

## 2017-02-06 VITALS — BP 120/56 | HR 67 | Ht 73.0 in | Wt 300.0 lb

## 2017-02-06 DIAGNOSIS — Z811 Family history of alcohol abuse and dependence: Secondary | ICD-10-CM | POA: Diagnosis not present

## 2017-02-06 DIAGNOSIS — Z79899 Other long term (current) drug therapy: Secondary | ICD-10-CM | POA: Diagnosis not present

## 2017-02-06 DIAGNOSIS — F1010 Alcohol abuse, uncomplicated: Secondary | ICD-10-CM

## 2017-02-06 DIAGNOSIS — Z888 Allergy status to other drugs, medicaments and biological substances status: Secondary | ICD-10-CM

## 2017-02-06 DIAGNOSIS — F1011 Alcohol abuse, in remission: Secondary | ICD-10-CM

## 2017-02-06 MED ORDER — NALTREXONE HCL 50 MG PO TABS: 50.0000 mg | ORAL_TABLET | Freq: Every day | ORAL | 5 refills | Status: AC
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
## 2017-04-22 ENCOUNTER — Ambulatory Visit (HOSPITAL_COMMUNITY): Payer: Self-pay | Admitting: Psychiatry

## 2017-05-08 DEATH — deceased

## 2023-10-21 ENCOUNTER — Encounter (HOSPITAL_COMMUNITY): Payer: Self-pay | Admitting: Psychiatry
